# Patient Record
Sex: Male | Born: 1975 | Race: Black or African American | Hispanic: No | Marital: Single | Smoking: Light tobacco smoker
Health system: Southern US, Community
[De-identification: ages and names within clinical notes are randomized; demographics above are authoritative.]

## PROBLEM LIST (undated history)

## (undated) DIAGNOSIS — E7800 Pure hypercholesterolemia, unspecified: Secondary | ICD-10-CM

## (undated) DIAGNOSIS — E669 Obesity, unspecified: Secondary | ICD-10-CM

## (undated) DIAGNOSIS — W3400XA Accidental discharge from unspecified firearms or gun, initial encounter: Secondary | ICD-10-CM

## (undated) DIAGNOSIS — M6282 Rhabdomyolysis: Secondary | ICD-10-CM

## (undated) DIAGNOSIS — N401 Enlarged prostate with lower urinary tract symptoms: Secondary | ICD-10-CM

## (undated) DIAGNOSIS — I10 Essential (primary) hypertension: Secondary | ICD-10-CM

## (undated) DIAGNOSIS — N138 Other obstructive and reflux uropathy: Secondary | ICD-10-CM

## (undated) HISTORY — DX: Other obstructive and reflux uropathy: N13.8

## (undated) HISTORY — PX: OTHER SURGICAL HISTORY: SHX169

## (undated) HISTORY — DX: Rhabdomyolysis: M62.82

## (undated) HISTORY — DX: Benign prostatic hyperplasia with lower urinary tract symptoms: N40.1

## (undated) HISTORY — DX: Accidental discharge from unspecified firearms or gun, initial encounter: W34.00XA

## (undated) HISTORY — DX: Obesity, unspecified: E66.9

---

## 1998-06-18 ENCOUNTER — Encounter: Payer: Self-pay | Admitting: Emergency Medicine

## 1998-06-18 ENCOUNTER — Emergency Department (HOSPITAL_COMMUNITY): Admission: EM | Admit: 1998-06-18 | Discharge: 1998-06-18 | Payer: Self-pay | Admitting: Emergency Medicine

## 1999-02-06 DIAGNOSIS — W3400XA Accidental discharge from unspecified firearms or gun, initial encounter: Secondary | ICD-10-CM

## 1999-02-06 HISTORY — DX: Accidental discharge from unspecified firearms or gun, initial encounter: W34.00XA

## 1999-02-06 HISTORY — PX: FOREIGN BODY REMOVAL: SHX962

## 1999-02-13 ENCOUNTER — Encounter: Payer: Self-pay | Admitting: Emergency Medicine

## 1999-02-13 ENCOUNTER — Emergency Department (HOSPITAL_COMMUNITY): Admission: EM | Admit: 1999-02-13 | Discharge: 1999-02-13 | Payer: Self-pay | Admitting: Emergency Medicine

## 2011-04-23 ENCOUNTER — Emergency Department (HOSPITAL_COMMUNITY)
Admission: EM | Admit: 2011-04-23 | Discharge: 2011-04-23 | Disposition: A | Discharge status: Home / Self Care | Payer: Self-pay | Attending: Emergency Medicine | Admitting: Emergency Medicine

## 2011-04-23 DIAGNOSIS — Y9269 Other specified industrial and construction area as the place of occurrence of the external cause: Secondary | ICD-10-CM | POA: Insufficient documentation

## 2011-04-23 DIAGNOSIS — X58XXXA Exposure to other specified factors, initial encounter: Secondary | ICD-10-CM | POA: Insufficient documentation

## 2011-04-23 DIAGNOSIS — IMO0002 Reserved for concepts with insufficient information to code with codable children: Secondary | ICD-10-CM | POA: Insufficient documentation

## 2011-04-23 DIAGNOSIS — Y990 Civilian activity done for income or pay: Secondary | ICD-10-CM | POA: Insufficient documentation

## 2012-09-07 DIAGNOSIS — M6282 Rhabdomyolysis: Secondary | ICD-10-CM

## 2012-09-07 HISTORY — DX: Rhabdomyolysis: M62.82

## 2013-01-18 ENCOUNTER — Encounter (HOSPITAL_COMMUNITY): Payer: Self-pay | Admitting: *Deleted

## 2013-01-18 ENCOUNTER — Observation Stay (HOSPITAL_COMMUNITY)
Admission: EM | Admit: 2013-01-18 | Discharge: 2013-01-19 | Disposition: A | Discharge status: Home / Self Care | Payer: 59 | Attending: Internal Medicine | Admitting: Internal Medicine

## 2013-01-18 DIAGNOSIS — R197 Diarrhea, unspecified: Secondary | ICD-10-CM | POA: Insufficient documentation

## 2013-01-18 DIAGNOSIS — N179 Acute kidney failure, unspecified: Secondary | ICD-10-CM | POA: Insufficient documentation

## 2013-01-18 DIAGNOSIS — E785 Hyperlipidemia, unspecified: Secondary | ICD-10-CM | POA: Diagnosis present

## 2013-01-18 DIAGNOSIS — R109 Unspecified abdominal pain: Secondary | ICD-10-CM | POA: Diagnosis present

## 2013-01-18 DIAGNOSIS — R1084 Generalized abdominal pain: Principal | ICD-10-CM | POA: Insufficient documentation

## 2013-01-18 DIAGNOSIS — F17200 Nicotine dependence, unspecified, uncomplicated: Secondary | ICD-10-CM | POA: Insufficient documentation

## 2013-01-18 DIAGNOSIS — E860 Dehydration: Secondary | ICD-10-CM

## 2013-01-18 DIAGNOSIS — E7800 Pure hypercholesterolemia, unspecified: Secondary | ICD-10-CM | POA: Insufficient documentation

## 2013-01-18 DIAGNOSIS — M6282 Rhabdomyolysis: Secondary | ICD-10-CM | POA: Insufficient documentation

## 2013-01-18 DIAGNOSIS — R112 Nausea with vomiting, unspecified: Secondary | ICD-10-CM | POA: Insufficient documentation

## 2013-01-18 HISTORY — DX: Pure hypercholesterolemia, unspecified: E78.00

## 2013-01-18 LAB — CBC WITH DIFFERENTIAL/PLATELET
Basophils Absolute: 0 10*3/uL (ref 0.0–0.1)
Basophils Relative: 0 % (ref 0–1)
Eosinophils Absolute: 0.2 10*3/uL (ref 0.0–0.7)
Eosinophils Relative: 1 % (ref 0–5)
HCT: 42.1 % (ref 39.0–52.0)
Hemoglobin: 14.5 g/dL (ref 13.0–17.0)
Lymphocytes Relative: 18 % (ref 12–46)
Lymphs Abs: 2.8 10*3/uL (ref 0.7–4.0)
MCH: 29.8 pg (ref 26.0–34.0)
MCHC: 34.4 g/dL (ref 30.0–36.0)
MCV: 86.6 fL (ref 78.0–100.0)
Monocytes Absolute: 0.9 10*3/uL (ref 0.1–1.0)
Monocytes Relative: 6 % (ref 3–12)
Neutro Abs: 11.4 10*3/uL — ABNORMAL HIGH (ref 1.7–7.7)
Neutrophils Relative %: 75 % (ref 43–77)
Platelets: 199 10*3/uL (ref 150–400)
RBC: 4.86 MIL/uL (ref 4.22–5.81)
RDW: 14.2 % (ref 11.5–15.5)
WBC: 15.3 10*3/uL — ABNORMAL HIGH (ref 4.0–10.5)

## 2013-01-18 LAB — COMPREHENSIVE METABOLIC PANEL
ALT: 36 U/L (ref 0–53)
AST: 93 U/L — ABNORMAL HIGH (ref 0–37)
CO2: 28 mEq/L (ref 19–32)
Calcium: 9.5 mg/dL (ref 8.4–10.5)
Chloride: 104 mEq/L (ref 96–112)
GFR calc non Af Amer: 32 mL/min — ABNORMAL LOW (ref >90)
Sodium: 140 mEq/L (ref 135–145)
Total Bilirubin: 0.5 mg/dL (ref 0.3–1.2)

## 2013-01-18 LAB — URINE MICROSCOPIC-ADD ON

## 2013-01-18 LAB — URINALYSIS, ROUTINE W REFLEX MICROSCOPIC
Glucose, UA: NEGATIVE mg/dL
Protein, ur: NEGATIVE mg/dL
pH: 6 (ref 5.0–8.0)

## 2013-01-18 LAB — CK: Total CK: 9041 U/L — ABNORMAL HIGH (ref 7–232)

## 2013-01-18 MED ORDER — ACETAMINOPHEN 325 MG PO TABS
650.0000 mg | ORAL_TABLET | Freq: Four times a day (QID) | ORAL | Status: DC | PRN
  Administered 2013-01-18: 650 mg via ORAL
  Filled 2013-01-18: qty 2

## 2013-01-18 MED ORDER — ENOXAPARIN SODIUM 60 MG/0.6ML ~~LOC~~ SOLN
60.0000 mg | Freq: Every day | SUBCUTANEOUS | Status: DC
  Administered 2013-01-18: 60 mg via SUBCUTANEOUS
  Filled 2013-01-18 (×2): qty 0.6

## 2013-01-18 MED ORDER — ONDANSETRON HCL 4 MG PO TABS: 4.0000 mg | ORAL_TABLET | Freq: Four times a day (QID) | ORAL | Status: DC | PRN

## 2013-01-18 MED ORDER — ENOXAPARIN SODIUM 40 MG/0.4ML ~~LOC~~ SOLN: 40.0000 mg | SUBCUTANEOUS | Status: DC

## 2013-01-18 MED ORDER — ONDANSETRON 4 MG PO TBDP
8.0000 mg | ORAL_TABLET | Freq: Once | ORAL | Status: AC
  Administered 2013-01-18: 8 mg via ORAL
  Filled 2013-01-18: qty 2

## 2013-01-18 MED ORDER — SODIUM CHLORIDE 0.9 % IV BOLUS (SEPSIS)
1000.0000 mL | Freq: Once | INTRAVENOUS | Status: AC
  Administered 2013-01-18: 1000 mL via INTRAVENOUS

## 2013-01-18 MED ORDER — ONDANSETRON HCL 4 MG/2ML IJ SOLN: 4.0000 mg | Freq: Four times a day (QID) | INTRAMUSCULAR | Status: DC | PRN

## 2013-01-18 MED ORDER — ACETAMINOPHEN 650 MG RE SUPP: 650.0000 mg | Freq: Four times a day (QID) | RECTAL | Status: DC | PRN

## 2013-01-18 MED ORDER — SODIUM CHLORIDE 0.9 % IV SOLN
INTRAVENOUS | Status: DC
  Administered 2013-01-18 – 2013-01-19 (×2): via INTRAVENOUS

## 2013-01-18 NOTE — ED Provider Notes (Addendum)
History     CSN: [id number]  Arrival date & time 01/18/13  1906   First MD Initiated Contact with Patient 01/18/13 1934      Chief Complaint  Patient presents with  . Abdominal Pain    Patient is a 37 y.o. male presenting with abdominal pain. The history is provided by the patient.  Abdominal Pain Pain location:  Epigastric Pain quality: aching and cramping   Pain radiates to:  Does not radiate Onset quality:  Gradual Duration:  2 days Timing:  Intermittent Progression:  Unchanged Chronicity:  New Relieved by:  Nothing Worsened by:  Eating Associated symptoms: diarrhea, nausea and vomiting   Associated symptoms: no chest pain, no fever, no hematemesis and no hematochezia   pt presents for abdominal pain, nausea vomiting and diarrhea He reports he ate at restaurant two days ago, and soon after developed vomiting and diarrhea.  He reports now he has lack of appetite and only passing flatus.  He reports cramping abdominal pain.   He had otherwise been well prior He reports +urine output  Past Medical History  Diagnosis Date  . Hypercholesteremia     History reviewed. No pertinent past surgical history.  History reviewed. No pertinent family history.  History  Substance Use Topics  . Smoking status: Light Tobacco Smoker  . Smokeless tobacco: Never Used  . Alcohol Use: Yes      Review of Systems  Constitutional: Negative for fever.  Cardiovascular: Negative for chest pain.  Gastrointestinal: Positive for nausea, vomiting, abdominal pain and diarrhea. Negative for hematochezia and hematemesis.  All other systems reviewed and are negative.    Allergies  Amoxicillin; Other; and Penicillins  Home Medications   Current Outpatient Rx  Name  Route  Sig  Dispense  Refill  . niacin (NIASPAN) 500 MG CR tablet   Oral   Take 500 mg by mouth at bedtime.           BP 141/90  Pulse 81  Temp(Src) 98.2 F (36.8 C)  Resp 16  SpO2 94%  Physical  Exam CONSTITUTIONAL: Well developed/well nourished HEAD: Normocephalic/atraumatic EYES: EOMI/PERRL ENMT: Mucous membranes moist NECK: supple no meningeal signs SPINE:entire spine nontender CV: S1/S2 noted, no murmurs/rubs/gallops noted LUNGS: Lungs are clear to auscultation bilaterally, no apparent distress ABDOMEN: soft, mild epigastric tenderness, no rebound or guarding GU:no cva tenderness NEURO: Pt is awake/alert, moves all extremitiesx4 EXTREMITIES: pulses normal, full ROM SKIN: warm, color normal PSYCH: no abnormalities of mood noted  ED Course  Procedures  Labs Reviewed  CBC WITH DIFFERENTIAL - Abnormal; Notable for the following:    WBC 15.3 (*)    Neutro Abs 11.4 (*)    All other components within normal limits  COMPREHENSIVE METABOLIC PANEL - Abnormal; Notable for the following:    Creatinine, Ser 2.42 (*)    AST 93 (*)    GFR calc non Af Amer 32 (*)    GFR calc Af Amer 37 (*)    All other components within normal limits  URINALYSIS, ROUTINE W REFLEX MICROSCOPIC - Abnormal; Notable for the following:    Hgb urine dipstick SMALL (*)    All other components within normal limits  LIPASE, BLOOD  URINE MICROSCOPIC-ADD ON    9:04 PM Acute renal failure noted.  This is new onset.  Will admit Abdomen soft with mild epigastric tenderness.  Defer imaging for now   9:21 PM D/w [facility], to admit Pt told nurse he had recent increased working out/exercising, will check  CK while inpatient MDM  Nursing notes including past medical history and social history reviewed and considered in documentation Labs/vital reviewed and considered         Joya Gaskins, MD 01/18/13 2121  Joya Gaskins, MD 01/18/13 2123

## 2013-01-18 NOTE — ED Notes (Signed)
Pt states that after eating spinach on Monday from PF chang's that he began feeling nauseated, and vomited later the next day. His stomach has felt like a knot ever since and that his last BM was yesterday. He has been able to keep fluids down but has not had anything to eat since. Nausea comes and goes.
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
## 2013-01-18 NOTE — ED Notes (Signed)
Pt stated he just started working out again. Pt has not worked out in four months. Pt states he has been working out hard for the past week. Pt reports soreness. Dr. Bebe Shaggy notified of pt's exercise regiment

## 2013-01-19 DIAGNOSIS — E860 Dehydration: Secondary | ICD-10-CM

## 2013-01-19 LAB — CBC WITH DIFFERENTIAL/PLATELET
Basophils Relative: 0 % (ref 0–1)
Eosinophils Relative: 1 % (ref 0–5)
HCT: 41.5 % (ref 39.0–52.0)
Hemoglobin: 13.8 g/dL (ref 13.0–17.0)
Lymphs Abs: 3.4 10*3/uL (ref 0.7–4.0)
MCH: 29.1 pg (ref 26.0–34.0)
MCV: 87.6 fL (ref 78.0–100.0)
Monocytes Absolute: 1.1 10*3/uL — ABNORMAL HIGH (ref 0.1–1.0)
Monocytes Relative: 9 % (ref 3–12)
Neutro Abs: 7.9 10*3/uL — ABNORMAL HIGH (ref 1.7–7.7)
RBC: 4.74 MIL/uL (ref 4.22–5.81)
WBC: 12.5 10*3/uL — ABNORMAL HIGH (ref 4.0–10.5)

## 2013-01-19 LAB — COMPREHENSIVE METABOLIC PANEL
AST: 97 U/L — ABNORMAL HIGH (ref 0–37)
Albumin: 3.7 g/dL (ref 3.5–5.2)
Calcium: 9.1 mg/dL (ref 8.4–10.5)
Chloride: 108 mEq/L (ref 96–112)
Creatinine, Ser: 2.29 mg/dL — ABNORMAL HIGH (ref 0.50–1.35)
Total Bilirubin: 0.3 mg/dL (ref 0.3–1.2)
Total Protein: 6.8 g/dL (ref 6.0–8.3)

## 2013-01-19 LAB — CK: Total CK: 9247 U/L — ABNORMAL HIGH (ref 7–232)

## 2013-01-19 LAB — TSH: TSH: 1.553 u[IU]/mL (ref 0.350–4.500)

## 2013-01-19 MED ORDER — ACETAMINOPHEN 325 MG PO TABS: 650.0000 mg | ORAL_TABLET | Freq: Four times a day (QID) | ORAL | Status: DC | PRN

## 2013-01-19 NOTE — Progress Notes (Signed)
Pt discharged to home after visit summary reviewed and pt capable of re verbalizing medications and follow up appointments. Pt remains stable. No signs and symptoms of distress. Educated to return to ER in the event of SOB, dizziness, chest pain, or fainting. Kimanh Templeman, RN   

## 2013-01-19 NOTE — Discharge Summary (Signed)
Physician Discharge Summary  VUK SKILLERN VWU:[id number] DOB: [birth year] DOA: 01/18/2013  PCP: Evlyn Courier, MD  Admit date: 01/18/2013 Discharge date: 01/19/2013  Time spent: 25 minutes  Recommendations for Outpatient Follow-up:  1. Follow up with primary care provider in 2 weeks (include homehealth, outpatient follow-up instructions, specific recommendations for PCP to follow-up on, etc.)  Discharge Diagnoses:  Principal Problem:   Abdominal pain Active Problems:   ARF (acute renal failure)   Rhabdomyolysis   Hyperlipidemia   Discharge Condition: Improved  Diet recommendation: Regular diet  Filed Weights   01/18/13 2255  Weight: 121.972 kg (268 lb 14.4 oz)    History of present illness:  Andrew Escobar is a 37 y.o. male history of hyperlipidemia started experiencing abdominal pain since Monday night that is 2 nights ago. Patient had some fried rice which he had bought a day ago. He had it around 12 noon and started experiencing nausea vomiting and diarrhea with crampy abdominal pain around 8 PM same day. His nausea and vomiting and that he has stopped but patient started having persistent crampy abdominal pain. The abdominal pain is generalized. Since the pain is persistent he came to the ER today. His creatinine was found to be mildly elevated from normal as patient states his labs done last October was normal. In addition patient's CK levels are found to be high. Patient states that he has been exercising after gap of 2 months over the last one week. Patient otherwise denies any fever chills chest pain or shortness of breath dizziness or loss of consciousness. He does have some body aches.   Hospital Course:  The patient was noted to the inpatient service. The patient was started on aggressive fluid hydration overnight for presumed infectious gastroenteritis. The following day, patient was able to tolerate a regular diet. His creatinine also improved with fluid hydration.  Patient reports improved bowel function. Patient is otherwise appropriate for hospital discharge with close outpatient follow up. Patient is instructed to wash as vigorously. Patient has been instructed to continue with Tylenol as needed for pain and to avoid nonsteroidal anti-inflammatories given his presenting to renal failure  Procedures:  None (i.e. Studies not automatically included, echos, thoracentesis, etc; not x-rays)  Consultations:  None  Discharge Exam: Filed Vitals:   01/18/13 1916 01/18/13 2255 01/19/13 0500 01/19/13 0900  BP:  136/91 134/74 148/98  Pulse:  65 64 67  Temp: 98.2 F (36.8 C) 98 F (36.7 C) 97.6 F (36.4 C) 98.2 F (36.8 C)  TempSrc:  Oral Oral Oral  Resp:  18 19 20   Weight:  121.972 kg (268 lb 14.4 oz)    SpO2:  96% 96% 98%    General: Awake no apparent distress Cardiovascular: Regular S1-S2 no murmurs Respiratory: Normal respiratory effort no crackles no wheezing  Discharge Instructions     Medication List    TAKE these medications       acetaminophen 325 MG tablet  Commonly known as:  TYLENOL  Take 2 tablets (650 mg total) by mouth every 6 (six) hours as needed for pain.     niacin 500 MG CR tablet  Commonly known as:  NIASPAN  Take 500 mg by mouth at bedtime.       Allergies  Allergen Reactions  . Amoxicillin     Unknown  . Other     Mayonnaise: Unknown reaction  . Penicillins     Unknown       Follow-up Information   Follow up with  Evlyn Courier, MD In 2 weeks.   Contact information:   [address] [place] [address] [phone]        The results of significant diagnostics from this hospitalization (including imaging, microbiology, ancillary and laboratory) are listed below for reference.    Significant Diagnostic Studies: No results found.  Microbiology: No results found for this or any previous visit (from the past 240 hour(s)).   Labs: Basic Metabolic Panel:  Recent Labs Lab  01/18/13 1935 01/19/13 0640  NA 140 142  Escobar 4.3 4.5  CL 104 108  CO2 28 23  GLUCOSE 92 91  BUN 16 16  CREATININE 2.42* 2.29*  CALCIUM 9.5 9.1   Liver Function Tests:  Recent Labs Lab 01/18/13 1935 01/19/13 0640  AST 93* 97*  ALT 36 43  ALKPHOS 46 45  BILITOT 0.5 0.3  PROT 7.1 6.8  ALBUMIN 3.8 3.7    Recent Labs Lab 01/18/13 1935  LIPASE 26   No results found for this basename: AMMONIA,  in the last 168 hours CBC:  Recent Labs Lab 01/18/13 1935 01/19/13 0640  WBC 15.3* 12.5*  NEUTROABS 11.4* 7.9*  HGB 14.5 13.8  HCT 42.1 41.5  MCV 86.6 87.6  PLT 199 200   Cardiac Enzymes:  Recent Labs Lab 01/18/13 1935 01/19/13 0640  CKTOTAL 9041* 9247*   BNP: BNP (last 3 results) No results found for this basename: PROBNP,  in the last 8760 hours CBG: No results found for this basename: GLUCAP,  in the last 168 hours     Signed:  CHIU, Andrew Escobar  [facility] 01/19/2013, 1:28 PM

## 2013-01-19 NOTE — Progress Notes (Addendum)
Pt admitted to the unit. Pt is alert and oriented. Pt oriented to room, staff, and call bell. Bed in lowest position. Full assessment to Epic. Call bell with in reach. Told to call for assists. Will continue to monitor. Burley Saver, RN

## 2015-06-07 ENCOUNTER — Emergency Department (HOSPITAL_COMMUNITY): Payer: Self-pay

## 2015-06-07 ENCOUNTER — Emergency Department (HOSPITAL_COMMUNITY)
Admission: EM | Admit: 2015-06-07 | Discharge: 2015-06-08 | Disposition: A | Discharge status: Home / Self Care | Payer: Self-pay | Attending: Emergency Medicine | Admitting: Emergency Medicine

## 2015-06-07 ENCOUNTER — Encounter (HOSPITAL_COMMUNITY): Payer: Self-pay | Admitting: Emergency Medicine

## 2015-06-07 DIAGNOSIS — Z720 Tobacco use: Secondary | ICD-10-CM | POA: Insufficient documentation

## 2015-06-07 DIAGNOSIS — Z880 Allergy status to penicillin: Secondary | ICD-10-CM | POA: Insufficient documentation

## 2015-06-07 DIAGNOSIS — R531 Weakness: Secondary | ICD-10-CM | POA: Insufficient documentation

## 2015-06-07 DIAGNOSIS — R030 Elevated blood-pressure reading, without diagnosis of hypertension: Secondary | ICD-10-CM | POA: Insufficient documentation

## 2015-06-07 DIAGNOSIS — IMO0001 Reserved for inherently not codable concepts without codable children: Secondary | ICD-10-CM

## 2015-06-07 DIAGNOSIS — R51 Headache: Secondary | ICD-10-CM | POA: Insufficient documentation

## 2015-06-07 DIAGNOSIS — Z8639 Personal history of other endocrine, nutritional and metabolic disease: Secondary | ICD-10-CM | POA: Insufficient documentation

## 2015-06-07 DIAGNOSIS — R079 Chest pain, unspecified: Secondary | ICD-10-CM | POA: Insufficient documentation

## 2015-06-07 DIAGNOSIS — R42 Dizziness and giddiness: Secondary | ICD-10-CM | POA: Insufficient documentation

## 2015-06-07 DIAGNOSIS — M791 Myalgia: Secondary | ICD-10-CM | POA: Insufficient documentation

## 2015-06-07 NOTE — ED Notes (Signed)
Pt. reports elevated blood pressure yesterday 160/100 with nausea , generalized weakness and mild frontal headache " pressure" .
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
## 2015-06-08 LAB — CBC WITH DIFFERENTIAL/PLATELET
BASOS ABS: 0.1 10*3/uL (ref 0.0–0.1)
BASOS PCT: 0 %
Eosinophils Absolute: 0.2 10*3/uL (ref 0.0–0.7)
Eosinophils Relative: 2 %
HEMATOCRIT: 42.7 % (ref 39.0–52.0)
HEMOGLOBIN: 14.4 g/dL (ref 13.0–17.0)
LYMPHS PCT: 33 %
Lymphs Abs: 3.9 10*3/uL (ref 0.7–4.0)
MCH: 29.6 pg (ref 26.0–34.0)
MCHC: 33.7 g/dL (ref 30.0–36.0)
MCV: 87.9 fL (ref 78.0–100.0)
MONO ABS: 0.6 10*3/uL (ref 0.1–1.0)
MONOS PCT: 5 %
NEUTROS ABS: 7.2 10*3/uL (ref 1.7–7.7)
NEUTROS PCT: 60 %
Platelets: 210 10*3/uL (ref 150–400)
RBC: 4.86 MIL/uL (ref 4.22–5.81)
RDW: 14.3 % (ref 11.5–15.5)
WBC: 12 10*3/uL — ABNORMAL HIGH (ref 4.0–10.5)

## 2015-06-08 LAB — COMPREHENSIVE METABOLIC PANEL
ALBUMIN: 4.3 g/dL (ref 3.5–5.0)
ALT: 19 U/L (ref 17–63)
ANION GAP: 9 (ref 5–15)
AST: 21 U/L (ref 15–41)
Alkaline Phosphatase: 50 U/L (ref 38–126)
BUN: 12 mg/dL (ref 6–20)
CALCIUM: 9.3 mg/dL (ref 8.9–10.3)
CO2: 25 mmol/L (ref 22–32)
Chloride: 105 mmol/L (ref 101–111)
Creatinine, Ser: 1.01 mg/dL (ref 0.61–1.24)
GFR calc non Af Amer: >60 mL/min (ref >60)
GLUCOSE: 115 mg/dL — AB (ref 65–99)
POTASSIUM: 3.7 mmol/L (ref 3.5–5.1)
SODIUM: 139 mmol/L (ref 135–145)
TOTAL PROTEIN: 6.7 g/dL (ref 6.5–8.1)
Total Bilirubin: 0.7 mg/dL (ref 0.3–1.2)

## 2015-06-08 LAB — TROPONIN I: Troponin I: <0.03 ng/mL (ref <0.031)

## 2015-06-08 NOTE — ED Notes (Signed)
Pt stable, ambulatory, states understanding of discharge instructions 

## 2015-06-08 NOTE — Discharge Instructions (Signed)
DASH Eating Plan °DASH stands for "Dietary Approaches to Stop Hypertension." The DASH eating plan is a healthy eating plan that has been shown to reduce high blood pressure (hypertension). Additional health benefits may include reducing the risk of type 2 diabetes mellitus, heart disease, and stroke. The DASH eating plan may also help with weight loss. °WHAT DO I NEED TO KNOW ABOUT THE DASH EATING PLAN? °For the DASH eating plan, you will follow these general guidelines: °· Choose foods with a percent daily value for sodium of less than 5% (as listed on the food label). °· Use salt-free seasonings or herbs instead of table salt or sea salt. °· Check with your health care provider or pharmacist before using salt substitutes. °· Eat lower-sodium products, often labeled as "lower sodium" or "no salt added." °· Eat fresh foods. °· Eat more vegetables, fruits, and low-fat dairy products. °· Choose whole grains. Look for the word "whole" as the first word in the ingredient list. °· Choose fish and skinless chicken or turkey more often than red meat. Limit fish, poultry, and meat to 6 oz (170 g) each day. °· Limit sweets, desserts, sugars, and sugary drinks. °· Choose heart-healthy fats. °· Limit cheese to 1 oz (28 g) per day. °· Eat more home-cooked food and less restaurant, buffet, and fast food. °· Limit fried foods. °· Cook foods using methods other than frying. °· Limit canned vegetables. If you do use them, rinse them well to decrease the sodium. °· When eating at a restaurant, ask that your food be prepared with less salt, or no salt if possible. °WHAT FOODS CAN I EAT? °Seek help from a dietitian for individual calorie needs. °Grains °Whole grain or whole wheat bread. Brown rice. Whole grain or whole wheat pasta. Quinoa, bulgur, and whole grain cereals. Low-sodium cereals. Corn or whole wheat flour tortillas. Whole grain cornbread. Whole grain crackers. Low-sodium crackers. °Vegetables °Fresh or frozen vegetables  (raw, steamed, roasted, or grilled). Low-sodium or reduced-sodium tomato and vegetable juices. Low-sodium or reduced-sodium tomato sauce and paste. Low-sodium or reduced-sodium canned vegetables.  °Fruits °All fresh, canned (in natural juice), or frozen fruits. °Meat and Other Protein Products °Ground beef (85% or leaner), grass-fed beef, or beef trimmed of fat. Skinless chicken or turkey. Ground chicken or turkey. Pork trimmed of fat. All fish and seafood. Eggs. Dried beans, peas, or lentils. Unsalted nuts and seeds. Unsalted canned beans. °Dairy °Low-fat dairy products, such as skim or 1% milk, 2% or reduced-fat cheeses, low-fat ricotta or cottage cheese, or plain low-fat yogurt. Low-sodium or reduced-sodium cheeses. °Fats and Oils °Tub margarines without trans fats. Light or reduced-fat mayonnaise and salad dressings (reduced sodium). Avocado. Safflower, olive, or canola oils. Natural peanut or almond butter. °Other °Unsalted popcorn and pretzels. °The items listed above may not be a complete list of recommended foods or beverages. Contact your dietitian for more options. °WHAT FOODS ARE NOT RECOMMENDED? °Grains °White bread. White pasta. White rice. Refined cornbread. Bagels and croissants. Crackers that contain trans fat. °Vegetables °Creamed or fried vegetables. Vegetables in a cheese sauce. Regular canned vegetables. Regular canned tomato sauce and paste. Regular tomato and vegetable juices. °Fruits °Dried fruits. Canned fruit in light or heavy syrup. Fruit juice. °Meat and Other Protein Products °Fatty cuts of meat. Ribs, chicken wings, bacon, sausage, bologna, salami, chitterlings, fatback, hot dogs, bratwurst, and packaged luncheon meats. Salted nuts and seeds. Canned beans with salt. °Dairy °Whole or 2% milk, cream, half-and-half, and cream cheese. Whole-fat or sweetened yogurt. Full-fat   cheeses or blue cheese. Nondairy creamers and whipped toppings. Processed cheese, cheese spreads, or cheese  curds. Condiments Onion and garlic salt, seasoned salt, table salt, and sea salt. Canned and packaged gravies. Worcestershire sauce. Tartar sauce. Barbecue sauce. Teriyaki sauce. Soy sauce, including reduced sodium. Steak sauce. Fish sauce. Oyster sauce. Cocktail sauce. Horseradish. Ketchup and mustard. Meat flavorings and tenderizers. Bouillon cubes. Hot sauce. Tabasco sauce. Marinades. Taco seasonings. Relishes. Fats and Oils Butter, stick margarine, lard, shortening, ghee, and bacon fat. Coconut, palm kernel, or palm oils. Regular salad dressings. Other Pickles and olives. Salted popcorn and pretzels. The items listed above may not be a complete list of foods and beverages to avoid. Contact your dietitian for more information. WHERE CAN I FIND MORE INFORMATION? National Heart, Lung, and [employer] Institute: travelstabloid.com Document Released: 08/13/2011 Document Revised: 01/08/2014 Document Reviewed: 06/28/2013 [facility] Patient Information 2015 [place], [place]. This information is not intended to replace advice given to you by your health care provider. Make sure you discuss any questions you have with your health care provider.  Hypertension Hypertension, commonly called high blood pressure, is when the force of blood pumping through your arteries is too strong. Your arteries are the blood vessels that carry blood from your heart throughout your body. A blood pressure reading consists of a higher number over a lower number, such as 110/72. The higher number (systolic) is the pressure inside your arteries when your heart pumps. The lower number (diastolic) is the pressure inside your arteries when your heart relaxes. Ideally you want your blood pressure below 120/80. Hypertension forces your heart to work harder to pump blood. Your arteries may become narrow or stiff. Having hypertension puts you at risk for heart disease, stroke, and other problems.  RISK  FACTORS Some risk factors for high blood pressure are controllable. Others are not.  Risk factors you cannot control include:   Race. You may be at higher risk if you are African American.  Age. Risk increases with age.  Gender. Men are at higher risk than women before age 67 years. After age 26, women are at higher risk than men. Risk factors you can control include:  Not getting enough exercise or physical activity.  Being overweight.  Getting too much fat, sugar, calories, or salt in your diet.  Drinking too much alcohol. SIGNS AND SYMPTOMS Hypertension does not usually cause signs or symptoms. Extremely high blood pressure (hypertensive crisis) may cause headache, anxiety, shortness of breath, and nosebleed. DIAGNOSIS  To check if you have hypertension, your health care provider will measure your blood pressure while you are seated, with your arm held at the level of your heart. It should be measured at least twice using the same arm. Certain conditions can cause a difference in blood pressure between your right and left arms. A blood pressure reading that is higher than normal on one occasion does not mean that you need treatment. If one blood pressure reading is high, ask your health care provider about having it checked again. TREATMENT  Treating high blood pressure includes making lifestyle changes and possibly taking medicine. Living a healthy lifestyle can help lower high blood pressure. You may need to change some of your habits. Lifestyle changes may include:  Following the DASH diet. This diet is high in fruits, vegetables, and whole grains. It is low in salt, red meat, and added sugars.  Getting at least 2 hours of brisk physical activity every week.  Losing weight if necessary.  Not smoking.  Limiting alcoholic beverages.  Learning ways to reduce stress. If lifestyle changes are not enough to get your blood pressure under control, your health care provider may  prescribe medicine. You may need to take more than one. Work closely with your health care provider to understand the risks and benefits. HOME CARE INSTRUCTIONS  Have your blood pressure rechecked as directed by your health care provider.   Take medicines only as directed by your health care provider. Follow the directions carefully. Blood pressure medicines must be taken as prescribed. The medicine does not work as well when you skip doses. Skipping doses also puts you at risk for problems.   Do not smoke.   Monitor your blood pressure at home as directed by your health care provider. SEEK MEDICAL CARE IF:   You think you are having a reaction to medicines taken.  You have recurrent headaches or feel dizzy.  You have swelling in your ankles.  You have trouble with your vision. SEEK IMMEDIATE MEDICAL CARE IF:  You develop a severe headache or confusion.  You have unusual weakness, numbness, or feel faint.  You have severe chest or abdominal pain.  You vomit repeatedly.  You have trouble breathing. MAKE SURE YOU:   Understand these instructions.  Will watch your condition.  Will get help right away if you are not doing well or get worse. Document Released: 08/24/2005 Document Revised: 01/08/2014 Document Reviewed: 06/16/2013 [facility] Patient Information 2015 [place], [place]. This information is not intended to replace advice given to you by your health care provider. Make sure you discuss any questions you have with your health care provider.  How to Take Your Blood Pressure HOW DO I GET A BLOOD PRESSURE MACHINE?  You can buy an electronic home blood pressure machine at your local pharmacy. Insurance will sometimes cover the cost if you have a prescription.  Ask your doctor what type of machine is best for you. There are different machines for your arm and your wrist.  If you decide to buy a machine to check your blood pressure on your arm, first check the size of  your arm so you can buy the right size cuff. To check the size of your arm:   Use a measuring tape that shows both inches and centimeters.   Wrap the measuring tape around the upper-middle part of your arm. You may need someone to help you measure.   Write down your arm measurement in both inches and centimeters.   To measure your blood pressure correctly, it is important to have the right size cuff.   If your arm is up to 13 inches (up to 34 centimeters), get an adult cuff size.  If your arm is 13 to 17 inches (35 to 44 centimeters), get a large adult cuff size.    If your arm is 17 to 20 inches (45 to 52 centimeters), get an adult thigh cuff.  WHAT DO THE NUMBERS MEAN?   There are two numbers that make up your blood pressure. For example: 120/80.  The first number (120 in our example) is called the "systolic pressure." It is a measure of the pressure in your blood vessels when your heart is pumping blood.  The second number (80 in our example) is called the "diastolic pressure." It is a measure of the pressure in your blood vessels when your heart is resting between beats.  Your doctor will tell you what your blood pressure should be. WHAT SHOULD I DO BEFORE  I CHECK MY BLOOD PRESSURE?   Try to rest or relax for at least 30 minutes before you check your blood pressure.  Do not smoke.  Do not have any drinks with caffeine, such as:  Soda.  Coffee.  Tea.  Check your blood pressure in a quiet room.  Sit down and stretch out your arm on a table. Keep your arm at about the level of your heart. Let your arm relax.  Make sure that your legs are not crossed. HOW DO I CHECK MY BLOOD PRESSURE?  Follow the directions that came with your machine.  Make sure you remove any tight-fighting clothing from your arm or wrist. Wrap the cuff around your upper arm or wrist. You should be able to fit a finger between the cuff and your arm. If you cannot fit a finger between the cuff  and your arm, it is too tight and should be removed and rewrapped.  Some units require you to manually pump up the arm cuff.  Automatic units inflate the cuff when you press a button.  Cuff deflation is automatic in both models.  After the cuff is inflated, the unit measures your blood pressure and pulse. The readings are shown on a monitor. Hold still and breathe normally while the cuff is inflated.  Getting a reading takes less than a minute.  Some models store readings in a memory. Some provide a printout of readings. If your machine does not store your readings, keep a written record.  Take readings with you to your next visit with your doctor. Document Released: 08/06/2008 Document Revised: 01/08/2014 Document Reviewed: 10/19/2013 [facility] Patient Information 2015 [place], [place]. This information is not intended to replace advice given to you by your health care provider. Make sure you discuss any questions you have with your health care provider.

## 2015-07-05 ENCOUNTER — Encounter (HOSPITAL_COMMUNITY): Payer: Self-pay | Admitting: *Deleted

## 2015-07-05 ENCOUNTER — Emergency Department (HOSPITAL_COMMUNITY)
Admission: EM | Admit: 2015-07-05 | Discharge: 2015-07-05 | Disposition: A | Discharge status: Home / Self Care | Payer: Self-pay | Attending: Emergency Medicine | Admitting: Emergency Medicine

## 2015-07-05 DIAGNOSIS — Z880 Allergy status to penicillin: Secondary | ICD-10-CM | POA: Insufficient documentation

## 2015-07-05 DIAGNOSIS — Z8639 Personal history of other endocrine, nutritional and metabolic disease: Secondary | ICD-10-CM | POA: Insufficient documentation

## 2015-07-05 DIAGNOSIS — K029 Dental caries, unspecified: Secondary | ICD-10-CM | POA: Insufficient documentation

## 2015-07-05 DIAGNOSIS — Z720 Tobacco use: Secondary | ICD-10-CM | POA: Insufficient documentation

## 2015-07-05 MED ORDER — OXYCODONE-ACETAMINOPHEN 5-325 MG PO TABS
1.0000 | ORAL_TABLET | Freq: Once | ORAL | Status: AC
  Administered 2015-07-05: 1 via ORAL
  Filled 2015-07-05: qty 1

## 2015-07-05 MED ORDER — CLINDAMYCIN HCL 150 MG PO CAPS: 300.0000 mg | ORAL_CAPSULE | Freq: Three times a day (TID) | ORAL | Status: DC

## 2015-07-05 MED ORDER — HYDROCODONE-ACETAMINOPHEN 5-325 MG PO TABS: 1.0000 | ORAL_TABLET | Freq: Four times a day (QID) | ORAL | Status: DC | PRN

## 2015-07-05 MED ORDER — NAPROXEN 500 MG PO TABS: 500.0000 mg | ORAL_TABLET | Freq: Two times a day (BID) | ORAL | Status: DC

## 2015-07-05 MED ORDER — CLINDAMYCIN HCL 150 MG PO CAPS
300.0000 mg | ORAL_CAPSULE | Freq: Once | ORAL | Status: AC
  Administered 2015-07-05: 300 mg via ORAL
  Filled 2015-07-05: qty 2
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
## 2015-07-05 NOTE — ED Notes (Signed)
Pt reports severe right side dental pain that is also causing headache.

## 2015-07-05 NOTE — Discharge Instructions (Signed)
Call Dr. Mariel Sleet on Monday and tell them you were seen in the ED and need follow up  Dental Caries Dental caries is tooth decay. This decay can cause a hole in teeth (cavity) that can get bigger and deeper over time. HOME CARE  Brush and floss your teeth. Do this at least two times a day.  Use a fluoride toothpaste.  Use a mouth rinse if told by your dentist or doctor.  Eat less sugary and starchy foods. Drink less sugary drinks.  Avoid snacking often on sugary and starchy foods. Avoid sipping often on sugary drinks.  Keep regular checkups and cleanings with your dentist.  Use fluoride supplements if told by your dentist or doctor.  Allow fluoride to be applied to teeth if told by your dentist or doctor.   This information is not intended to replace advice given to you by your health care provider. Make sure you discuss any questions you have with your health care provider.   Document Released: 06/02/2008 Document Revised: 09/14/2014 Document Reviewed: 08/26/2012 Elsevier Interactive Patient Education [employer].

## 2015-07-06 DIAGNOSIS — H5710 Ocular pain, unspecified eye: Secondary | ICD-10-CM | POA: Insufficient documentation

## 2015-07-06 DIAGNOSIS — Z720 Tobacco use: Secondary | ICD-10-CM | POA: Insufficient documentation

## 2015-07-06 DIAGNOSIS — K029 Dental caries, unspecified: Secondary | ICD-10-CM | POA: Insufficient documentation

## 2015-07-06 DIAGNOSIS — Z880 Allergy status to penicillin: Secondary | ICD-10-CM | POA: Insufficient documentation

## 2015-07-06 DIAGNOSIS — Z791 Long term (current) use of non-steroidal anti-inflammatories (NSAID): Secondary | ICD-10-CM | POA: Insufficient documentation

## 2015-07-06 DIAGNOSIS — Z792 Long term (current) use of antibiotics: Secondary | ICD-10-CM | POA: Insufficient documentation

## 2015-07-06 DIAGNOSIS — Z8639 Personal history of other endocrine, nutritional and metabolic disease: Secondary | ICD-10-CM | POA: Insufficient documentation

## 2015-07-07 ENCOUNTER — Emergency Department (HOSPITAL_COMMUNITY)
Admission: EM | Admit: 2015-07-07 | Discharge: 2015-07-07 | Disposition: A | Discharge status: Home / Self Care | Payer: Self-pay | Attending: Emergency Medicine | Admitting: Emergency Medicine

## 2015-07-07 ENCOUNTER — Encounter (HOSPITAL_COMMUNITY): Payer: Self-pay | Admitting: *Deleted

## 2015-07-07 ENCOUNTER — Encounter (HOSPITAL_COMMUNITY): Payer: Self-pay | Admitting: Emergency Medicine

## 2015-07-07 DIAGNOSIS — R202 Paresthesia of skin: Secondary | ICD-10-CM | POA: Insufficient documentation

## 2015-07-07 DIAGNOSIS — Z880 Allergy status to penicillin: Secondary | ICD-10-CM | POA: Insufficient documentation

## 2015-07-07 DIAGNOSIS — R063 Periodic breathing: Secondary | ICD-10-CM | POA: Insufficient documentation

## 2015-07-07 DIAGNOSIS — R0682 Tachypnea, not elsewhere classified: Secondary | ICD-10-CM

## 2015-07-07 DIAGNOSIS — Z8639 Personal history of other endocrine, nutritional and metabolic disease: Secondary | ICD-10-CM | POA: Insufficient documentation

## 2015-07-07 DIAGNOSIS — Z791 Long term (current) use of non-steroidal anti-inflammatories (NSAID): Secondary | ICD-10-CM | POA: Insufficient documentation

## 2015-07-07 DIAGNOSIS — K0889 Other specified disorders of teeth and supporting structures: Secondary | ICD-10-CM

## 2015-07-07 MED ORDER — LORAZEPAM 1 MG PO TABS
1.0000 mg | ORAL_TABLET | Freq: Once | ORAL | Status: AC
  Administered 2015-07-07: 1 mg via ORAL
  Filled 2015-07-07: qty 1

## 2015-07-07 MED ORDER — FLUORESCEIN SODIUM 1 MG OP STRP: 1.0000 | ORAL_STRIP | Freq: Once | OPHTHALMIC | Status: DC

## 2015-07-07 MED ORDER — TETRACAINE HCL 0.5 % OP SOLN: 1.0000 [drp] | Freq: Once | OPHTHALMIC | Status: DC

## 2015-07-07 MED ORDER — OXYCODONE-ACETAMINOPHEN 5-325 MG PO TABS
1.0000 | ORAL_TABLET | Freq: Once | ORAL | Status: AC
  Administered 2015-07-07: 1 via ORAL
  Filled 2015-07-07: qty 1

## 2015-07-07 MED ORDER — OXYCODONE-ACETAMINOPHEN 5-325 MG PO TABS
1.0000 | ORAL_TABLET | Freq: Four times a day (QID) | ORAL | Status: DC | PRN
Start: 2015-07-07 — End: 2016-12-23

## 2015-07-07 MED ORDER — ONDANSETRON 4 MG PO TBDP
4.0000 mg | ORAL_TABLET | Freq: Once | ORAL | Status: AC
  Administered 2015-07-07: 4 mg via ORAL
  Filled 2015-07-07: qty 1

## 2015-07-07 MED ORDER — LORAZEPAM 1 MG PO TABS: 1.0000 mg | ORAL_TABLET | Freq: Once | ORAL | Status: DC

## 2015-07-07 MED ORDER — ACETAMINOPHEN 500 MG PO TABS: 500.0000 mg | ORAL_TABLET | Freq: Four times a day (QID) | ORAL | Status: DC | PRN
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
## 2015-07-07 NOTE — ED Notes (Signed)
Pt. reports right eye pain onset this evening , denies injury , no blurred vision , pt. seen here yesterday for for dental pain prescribed with pain medications and antibiotic . Pt. Has taken the pain medication with no relief.

## 2015-07-07 NOTE — ED Notes (Signed)
MD at bedside. 

## 2015-07-07 NOTE — ED Notes (Signed)
Pt reports about 20-30 minutes after taking clindamycin and percocet for a toothache he began to feel very anxious and nervous. Pt reports only allergy is to penicillin, advised he does not feel short of breath or like his tongue or throat is swelling. Patient appears very anxious.
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
## 2015-07-07 NOTE — ED Notes (Signed)
PA at the bedside.

## 2015-07-07 NOTE — Discharge Instructions (Signed)
Dental Pain Dental pain may be caused by many things, including:  Tooth decay (cavities or caries). Cavities expose the nerve of your tooth to air and hot or cold temperatures. This can cause pain or discomfort.  Abscess or infection. A dental abscess is a collection of infected pus from a bacterial infection in the inner part of the tooth (pulp). It usually occurs at the end of the tooth's root.  Injury.  An unknown reason (idiopathic). Your pain may be mild or severe. It may only occur when:  You are chewing.  You are exposed to hot or cold temperature.  You are eating or drinking sugary foods or beverages, such as soda or candy. Your pain may also be constant. HOME CARE INSTRUCTIONS Watch your dental pain for any changes. The following actions may help to lessen any discomfort that you are feeling:  Take medicines only as directed by your dentist.  If you were prescribed an antibiotic medicine, finish all of it even if you start to feel better.  Keep all follow-up visits as directed by your dentist. This is important.  Do not apply heat to the outside of your face.  Rinse your mouth or gargle with salt water if directed by your dentist. This helps with pain and swelling.  You can make salt water by adding  tsp of salt to 1 cup of warm water.  Apply ice to the painful area of your face:  Put ice in a plastic bag.  Place a towel between your skin and the bag.  Leave the ice on for 20 minutes, 2-3 times per day.  Avoid foods or drinks that cause you pain, such as:  Very hot or very cold foods or drinks.  Sweet or sugary foods or drinks. SEEK MEDICAL CARE IF:  Your pain is not controlled with medicines.  Your symptoms are worse.  You have new symptoms. SEEK IMMEDIATE MEDICAL CARE IF:  You are unable to open your mouth.  You are having trouble breathing or swallowing.  You have a fever.  Your face, neck, or jaw is swollen.   This information is not  intended to replace advice given to you by your health care provider. Make sure you discuss any questions you have with your health care provider.   Document Released: 08/24/2005 Document Revised: 01/08/2015 Document Reviewed: 08/20/2014 Elsevier Interactive Patient Education 2016 [place] GUIDE  Chronic Pain Problems: Contact [facility]  [phone] Patients need to be referred by their primary care doctor.  Insufficient Money for Medicine: Contact [employer]:  call "211" or [place] [phone].  No Primary Care Doctor: Call Health Connect  [phone] - can help you locate a primary care doctor that  accepts your insurance, provides certain services, etc. Physician Referral [phone]  Agencies that provide inexpensive medical care: [name]  [facility]  [phone] [facility]  [phone] [facility]  [phone] [facility]  [phone] [facility]  [phone]  [place] Providers: [facility]- [address]  [phone], Mon-Fri 9am-7pm, Sat 9am-1pm [place], [address] [place]  [place], [address] [place]  [place]- [address]  [place], [address], [phone]  Only accepts [place] patients after they have their name  applied to their card  Self Pay (no insurance) in [facility]: Sickle Cell Patients: Dr Kevan Ny, [facility]  [place], [facility]- [place]  [facility]- [address], [facility]- see information above (Speak to [employer] if you do not have insurance)       -  [facility]- [place], [place],  [place], [place]  Dr Vista Lawman-  [address], [place], [place] [facility]- [address] [phone]       -  [facility]- [address], [place], also [address], [phone]       -    [facility]- [address], [place], 1st & 3rd Saturday   every month, 10am-1pm  1) Find a Doctor and Pay Out of Pocket Although you won't have to find out who is covered by your insurance plan, it is a good idea to ask around and get recommendations. You will then need to call the office and see if the doctor you have chosen will accept you as a new patient and what types of options they offer for patients who are self-pay. Some doctors offer discounts or will set up payment plans for their patients who do not have insurance, but you will need to ask so you aren't surprised when you get to your appointment.  2) Contact Your Local Health Department Not all health departments have doctors that can see patients for sick visits, but many do, so it is worth a call to see if yours does. If you don't know where your local health department is, you can check in your phone book. The CDC also has a tool to help you locate your state's health department, and many state websites also have listings of all of their local health departments.  3) Find a [place] [facility] If your illness is not likely to be very severe or complicated, you may want to try a walk in clinic. These are popping up all over the country in pharmacies, drugstores, and shopping centers. They're usually staffed by nurse practitioners or physician assistants that have been trained to treat common illnesses and complaints. They're usually fairly quick and inexpensive. However, if you have serious medical issues or chronic  medical problems, these are probably not your best option  [facility] [place], [facility], [address] [place], phone [phone] or [phone].  Monday - Friday, call for an appointment. [place], [facility], [address], [place], phone [phone] or [phone].  Monday - Friday, call for an appointment.  Abuse/Neglect: [place] [phone] [place] [phone] (After Hours)  Emergency Shelter:  [facility] [phone]  Maternity Homes: Room at the [place] [phone] [place] [phone]  MRSA Hotline #:   334-175-6173  [facility]  [facility] Dept. [address]         [place] Phone:  [phone]                                  Phone:  [phone]                   Phone:  [phone]  [facility], [place]- [phone]       -     [facility] in [place], [address],                                  [place] [phone] or [phone] (After Hours)   [place]  Substance Abuse Resources: Alcohol and Drug Services  [phone] [place] [phone] [facility] Chinita Pester [phone] Residential & Outpatient Substance Abuse Program  [phone]  Psychological Services: [place]  [phone] [place]  [place], [place]. [address] [place], [place]:  [phone] or [phone], [email or web address]  Dental Assistance  If unable to pay or uninsured, contact:  [facility] or [facility]. to become qualified for the adult dental clinic.  Patients with Medicaid: [facility] [phone] [address] [place], [place] [address] [phone]  If unable to pay, or uninsured, contact [facility] ([phone]) or [place] [phone] in [place], [place] in [facility]) to become qualified for the adult dental clinic  Other [place]- [place], [place], [place], [address], [place], [place], 2nd and 4th Thursday of the month at 6:30am.  10 clients each day by appointment, can sometimes see walk-in patients if someone does not show for an appointment. [facility]- [address] Hillard Danker [place], [place], [address], [place], [place], [place], [address], [place] [facility] [phone]  Please make every effort to establish with a primary care physician for routine medical care  [place]  The [place] provides a wide range of adult health services. Some of these services are designed to address the healthcare needs of all [facility] residents and all services are designed to meet the needs of uninsured/underinsured low income residents. Some services are available to any resident of [place], call [phone] for details. ] The [facility], a new medical clinic for adults, is now open. For more information about the Center and its services please call  [phone]. For information on our [place] services, click here.  For more information on any of the following  Department of Public Health programs, including hours of service, click on the highlighted link.  SERVICES FOR WOMEN (Adults and Teens) [employer] provide a full range of birth control options plus education and counseling. New patient visit and annual return visits include a complete examination, pap test as indicated, and other laboratory as indicated. Included is our [employer] for men.  Maternity Care is provided through pregnancy, including a six week post partum exam. Women who meet eligibility criteria for the Medicaid for Pregnant Women program, receive care free. Other women are charged on a sliding scale according to income. Note: [facility] provides services to pregnant women who have a Medicaid card. Call [phone] for an appointment in [place] or [phone] for an appointment in [facility].  Primary Care for Medicaid [place] Access Women is available through the [place]. As primary care provider for the [place] program, women may designate the [facility] as their primary care provider.  PLEASE CALL R5958090 FOR AN APPOINTMENT FOR THE ABOVE SERVICES IN EITHER [place] OR [place]. Information available in [place] and [place].   Childbirth Education Classes are open to the public and offered to help families prepare for the best possible childbirth experience as well as to promote lifelong health and wellness. Classes are offered throughout the year and meet on the same night once a week for five weeks. Medicaid covers the cost of the classes for the mother-to-be and her partner. For participants without Medicaid, the cost of the class series is $45.00 for the mother-to-be and her partner. Class size is limited and registration is required. For more information or to register call [phone]. Baby items donated by [employer] and the [employer] of  [place] are given away during each class series.  SERVICES FOR WOMEN AND MEN Sexually Transmitted Infection appointments, including HIV testing, are available daily (weekdays, except holidays). Call early as same-day appointments are limited. For an appointment in either [facility] or [place], call [phone]. Services are confidential and free of charge.  Skin Testing for Tuberculosis Please call [phone]. Adult Immunizations are available, usually for a fee. Please call [phone] for details.  PLEASE CALL [phone] FOR AN APPOINTMENT FOR THE ABOVE SERVICES IN EITHER [place] OR [place].   [facility] provides up to the minute recommended vaccines for your travel destination. We also provide essential health and political information to help insure a safe and pleasurable travel experience. This program is self-sustaining, however, fees are very competitive. We are a CERTIFIED YELLOW FEVER IMMUNIZATION approved clinic site. PLEASE CALL [phone] FOR AN APPOINTMENT IN EITHER [place] OR [place].   If you have questions about the services listed above, we want to answer them! Email [place] at: [email or web address] Home Visiting Services for elderly and the disabled are available to residents of [facility] who are in need of care that compares to the care offered by a nursing home, have needs that can be met by the program, and have [place] Medicaid. Other short term services are available to residents 18 years and older who are unable to meet requirements for eligibility to receive services from a certified home health agency, spend the majority of time at home, and need care for six months or less.  PLEASE CALL H548482 OR [phone] FOR MORE INFORMATION. Medication  Assistance Program serves as a link between pharmaceutical companies and patients to provide low cost or free prescription medications. This servce is available for residents who meet certain income  restrictions and have no insurance coverage.  PLEASE CALL [phone] ([place]) OR [phone] ([place]) FOR MORE INFORMATION.  Updated Feb. 21, 2013

## 2015-07-07 NOTE — ED Notes (Signed)
Pt is here with anxiousness, sob, and thinks he is having a reaction to either the percocet or clindamycin he has had twice today. Pt states everyday it gets worse after taking the medication.

## 2016-03-11 ENCOUNTER — Ambulatory Visit (HOSPITAL_COMMUNITY)
Admission: EM | Admit: 2016-03-11 | Discharge: 2016-03-11 | Discharge status: Absent without leave | Payer: Managed Care, Other (non HMO)

## 2016-03-18 ENCOUNTER — Ambulatory Visit (HOSPITAL_COMMUNITY)
Admission: EM | Admit: 2016-03-18 | Discharge: 2016-03-18 | Disposition: A | Discharge status: Home / Self Care | Payer: Managed Care, Other (non HMO) | Attending: Family Medicine | Admitting: Family Medicine

## 2016-03-18 ENCOUNTER — Encounter (HOSPITAL_COMMUNITY): Payer: Self-pay | Admitting: *Deleted

## 2016-03-18 ENCOUNTER — Ambulatory Visit (INDEPENDENT_AMBULATORY_CARE_PROVIDER_SITE_OTHER): Payer: Managed Care, Other (non HMO)

## 2016-03-18 DIAGNOSIS — S8392XA Sprain of unspecified site of left knee, initial encounter: Secondary | ICD-10-CM

## 2016-03-18 MED ORDER — OXYCODONE-ACETAMINOPHEN 5-325 MG PO TABS: 1.0000 | ORAL_TABLET | ORAL | Status: DC | PRN

## 2016-03-18 NOTE — Discharge Instructions (Signed)
Cryotherapy Cryotherapy means treatment with cold. Ice or gel packs can be used to reduce both pain and swelling. Ice is the most helpful within the first 24 to 48 hours after an injury or flare-up from overusing a muscle or joint. Sprains, strains, spasms, burning pain, shooting pain, and aches can all be eased with ice. Ice can also be used when recovering from surgery. Ice is effective, has very few side effects, and is safe for most people to use. PRECAUTIONS  Ice is not a safe treatment option for people with:  Raynaud phenomenon. This is a condition affecting small blood vessels in the extremities. Exposure to cold may cause your problems to return.  Cold hypersensitivity. There are many forms of cold hypersensitivity, including:  Cold urticaria. Red, itchy hives appear on the skin when the tissues begin to warm after being iced.  Cold erythema. This is a red, itchy rash caused by exposure to cold.  Cold hemoglobinuria. Red blood cells break down when the tissues begin to warm after being iced. The hemoglobin that carry oxygen are passed into the urine because they cannot combine with blood proteins fast enough.  Numbness or altered sensitivity in the area being iced. If you have any of the following conditions, do not use ice until you have discussed cryotherapy with your caregiver:  Heart conditions, such as arrhythmia, angina, or chronic heart disease.  High blood pressure.  Healing wounds or open skin in the area being iced.  Current infections.  Rheumatoid arthritis.  Poor circulation.  Diabetes. Ice slows the blood flow in the region it is applied. This is beneficial when trying to stop inflamed tissues from spreading irritating chemicals to surrounding tissues. However, if you expose your skin to cold temperatures for too long or without the proper protection, you can damage your skin or nerves. Watch for signs of skin damage due to cold. HOME CARE INSTRUCTIONS Follow  these tips to use ice and cold packs safely.  Place a dry or damp towel between the ice and skin. A damp towel will cool the skin more quickly, so you may need to shorten the time that the ice is used.  For a more rapid response, add gentle compression to the ice.  Ice for no more than 10 to 20 minutes at a time. The bonier the area you are icing, the less time it will take to get the benefits of ice.  Check your skin after 5 minutes to make sure there are no signs of a poor response to cold or skin damage.  Rest 20 minutes or more between uses.  Once your skin is numb, you can end your treatment. You can test numbness by very lightly touching your skin. The touch should be so light that you do not see the skin dimple from the pressure of your fingertip. When using ice, most people will feel these normal sensations in this order: cold, burning, aching, and numbness.  Do not use ice on someone who cannot communicate their responses to pain, such as small children or people with dementia. HOW TO MAKE AN ICE PACK Ice packs are the most common way to use ice therapy. Other methods include ice massage, ice baths, and cryosprays. Muscle creams that cause a cold, tingly feeling do not offer the same benefits that ice offers and should not be used as a substitute unless recommended by your caregiver. To make an ice pack, do one of the following:  Place crushed ice or a  bag of frozen vegetables in a sealable plastic bag. Squeeze out the excess air. Place this bag inside another plastic bag. Slide the bag into a pillowcase or place a damp towel between your skin and the bag.  Mix 3 parts water with 1 part rubbing alcohol. Freeze the mixture in a sealable plastic bag. When you remove the mixture from the freezer, it will be slushy. Squeeze out the excess air. Place this bag inside another plastic bag. Slide the bag into a pillowcase or place a damp towel between your skin and the bag. SEEK MEDICAL CARE  IF:  You develop white spots on your skin. This may give the skin a blotchy (mottled) appearance.  Your skin turns blue or pale.  Your skin becomes waxy or hard.  Your swelling gets worse. MAKE SURE YOU:   Understand these instructions.  Will watch your condition.  Will get help right away if you are not doing well or get worse.   This information is not intended to replace advice given to you by your health care provider. Make sure you discuss any questions you have with your health care provider.   Document Released: 04/20/2011 Document Revised: 09/14/2014 Document Reviewed: 04/20/2011 Elsevier Interactive Patient Education 2016 [employer] Knee Pain Knee pain is a common problem. It can have many causes. The pain often goes away by following your doctor's home care instructions. Treatment for ongoing pain will depend on the cause of your pain. If your knee pain continues, more tests may be needed to diagnose your condition. Tests may include X-rays or other imaging studies of your knee. HOME CARE  Take medicines only as told by your doctor.  Rest your knee and keep it raised (elevated) while you are resting.  Do not do things that cause pain or make your pain worse.  Avoid activities where both feet leave the ground at the same time, such as running, jumping rope, or doing jumping jacks.  Apply ice to the knee area:  Put ice in a plastic bag.  Place a towel between your skin and the bag.  Leave the ice on for 20 minutes, 2-3 times a day.  Ask your doctor if you should wear an elastic knee support.  Sleep with a pillow under your knee.  Lose weight if you are overweight. Being overweight can make your knee hurt more.  Do not use any tobacco products, including cigarettes, chewing tobacco, or electronic cigarettes. If you need help quitting, ask your doctor. Smoking may slow the healing of any bone and joint problems that you may have. GET HELP IF:  Your knee  pain does not stop, it changes, or it gets worse.  You have a fever along with knee pain.  Your knee gives out or locks up.  Your knee becomes more swollen. GET HELP RIGHT AWAY IF:   Your knee feels hot to the touch.  You have chest pain or trouble breathing.   This information is not intended to replace advice given to you by your health care provider. Make sure you discuss any questions you have with your health care provider.   Document Released: 11/20/2008 Document Revised: 09/14/2014 Document Reviewed: 10/25/2013 Elsevier Interactive Patient Education [employer].
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
## 2016-03-18 NOTE — ED Notes (Signed)
Pt  Reports    He    Twisted    His  l    Knee   And felt   A  Pop -     The       Pt   Has  Pain  And   Some  Swelling         Especially  On  Weight  Bearing

## 2016-09-15 ENCOUNTER — Emergency Department (HOSPITAL_COMMUNITY): Payer: Managed Care, Other (non HMO)

## 2016-09-15 ENCOUNTER — Emergency Department (HOSPITAL_COMMUNITY)
Admission: EM | Admit: 2016-09-15 | Discharge: 2016-09-15 | Disposition: A | Discharge status: Home / Self Care | Payer: Managed Care, Other (non HMO) | Attending: Emergency Medicine | Admitting: Emergency Medicine

## 2016-09-15 ENCOUNTER — Encounter (HOSPITAL_COMMUNITY): Payer: Self-pay | Admitting: Emergency Medicine

## 2016-09-15 DIAGNOSIS — R079 Chest pain, unspecified: Secondary | ICD-10-CM | POA: Insufficient documentation

## 2016-09-15 DIAGNOSIS — Z5321 Procedure and treatment not carried out due to patient leaving prior to being seen by health care provider: Secondary | ICD-10-CM | POA: Insufficient documentation

## 2016-09-15 DIAGNOSIS — R05 Cough: Secondary | ICD-10-CM | POA: Insufficient documentation

## 2016-09-15 LAB — BASIC METABOLIC PANEL
ANION GAP: 6 (ref 5–15)
BUN: 17 mg/dL (ref 6–20)
CALCIUM: 9.2 mg/dL (ref 8.9–10.3)
CO2: 27 mmol/L (ref 22–32)
Chloride: 105 mmol/L (ref 101–111)
Creatinine, Ser: 1.11 mg/dL (ref 0.61–1.24)
GLUCOSE: 109 mg/dL — AB (ref 65–99)
POTASSIUM: 4.9 mmol/L (ref 3.5–5.1)
SODIUM: 138 mmol/L (ref 135–145)

## 2016-09-15 LAB — CBC
HCT: 42.6 % (ref 39.0–52.0)
HEMOGLOBIN: 14.7 g/dL (ref 13.0–17.0)
MCH: 29.6 pg (ref 26.0–34.0)
MCHC: 34.5 g/dL (ref 30.0–36.0)
MCV: 85.9 fL (ref 78.0–100.0)
Platelets: 214 10*3/uL (ref 150–400)
RBC: 4.96 MIL/uL (ref 4.22–5.81)
RDW: 14.3 % (ref 11.5–15.5)
WBC: 13 10*3/uL — ABNORMAL HIGH (ref 4.0–10.5)

## 2016-09-15 LAB — I-STAT TROPONIN, ED: TROPONIN I, POC: 0.01 ng/mL (ref 0.00–0.08)

## 2016-09-15 NOTE — ED Notes (Signed)
Called pt for reasses vitals no answer.

## 2016-09-15 NOTE — ED Triage Notes (Signed)
Pt reports productive cough x 1 month , starts sharp chest pain today. Denies shortness of breath.

## 2016-09-15 NOTE — ED Notes (Signed)
Pt request std check info PT MD will have to place order. **three gold tube in lab**

## 2016-09-16 ENCOUNTER — Emergency Department (HOSPITAL_COMMUNITY)
Admission: EM | Admit: 2016-09-16 | Discharge: 2016-09-16 | Disposition: A | Discharge status: Home / Self Care | Payer: Managed Care, Other (non HMO) | Attending: Emergency Medicine | Admitting: Emergency Medicine

## 2016-09-16 DIAGNOSIS — F17290 Nicotine dependence, other tobacco product, uncomplicated: Secondary | ICD-10-CM | POA: Insufficient documentation

## 2016-09-16 DIAGNOSIS — J40 Bronchitis, not specified as acute or chronic: Secondary | ICD-10-CM

## 2016-09-16 MED ORDER — BENZONATATE 100 MG PO CAPS: 100.0000 mg | ORAL_CAPSULE | Freq: Three times a day (TID) | ORAL | 0 refills | Status: DC

## 2016-09-16 MED ORDER — AZITHROMYCIN 250 MG PO TABS: 250.0000 mg | ORAL_TABLET | Freq: Every day | ORAL | 0 refills | Status: DC
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
## 2016-09-16 NOTE — Discharge Instructions (Signed)
Return if any problems.  See Dr. Berdine Addison for recheck if not improved in 1 week

## 2016-09-16 NOTE — ED Triage Notes (Addendum)
Pt reports chest pain that started yesterday  and productive cough for 1 month. Pt reports SOB as well. Pt was triaged and had labs drawn at [facility] last night but left due to wait

## 2016-09-16 NOTE — Discharge Planning (Signed)
Pt up for discharge. EDCM reviewed chart for possible CM needs.  No needs identified or communicated.  

## 2016-12-23 ENCOUNTER — Ambulatory Visit (HOSPITAL_COMMUNITY)
Admission: EM | Admit: 2016-12-23 | Discharge: 2016-12-23 | Disposition: A | Discharge status: Home / Self Care | Payer: Managed Care, Other (non HMO) | Attending: Family Medicine | Admitting: Family Medicine

## 2016-12-23 ENCOUNTER — Encounter (HOSPITAL_COMMUNITY): Payer: Self-pay | Admitting: Emergency Medicine

## 2016-12-23 DIAGNOSIS — T148XXA Other injury of unspecified body region, initial encounter: Secondary | ICD-10-CM

## 2016-12-23 DIAGNOSIS — Z23 Encounter for immunization: Secondary | ICD-10-CM

## 2016-12-23 DIAGNOSIS — S91331A Puncture wound without foreign body, right foot, initial encounter: Secondary | ICD-10-CM

## 2016-12-23 DIAGNOSIS — W450XXA Nail entering through skin, initial encounter: Secondary | ICD-10-CM

## 2016-12-23 MED ORDER — TETANUS-DIPHTH-ACELL PERTUSSIS 5-2.5-18.5 LF-MCG/0.5 IM SUSP
INTRAMUSCULAR | Status: AC
  Filled 2016-12-23: qty 0.5

## 2016-12-23 MED ORDER — TETANUS-DIPHTH-ACELL PERTUSSIS 5-2.5-18.5 LF-MCG/0.5 IM SUSP
0.5000 mL | Freq: Once | INTRAMUSCULAR | Status: AC
  Administered 2016-12-23: 0.5 mL via INTRAMUSCULAR
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
## 2016-12-23 NOTE — Discharge Instructions (Signed)
Keep the wound clean and dry. Otherwise for the next couple days she may soak her foot in some warm salty water and clean with soap and water and then dry it. Watch for any signs of infection. Read the instructions on your puncture wound information sheet. It appears that your developing an infection seek medical treatment promptly. Do not wait to see if it gets better. You have received a T dap immunization today.
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
## 2016-12-23 NOTE — ED Notes (Signed)
Puncture wound cleaned with a betadine scrub and covered with a band aid.  Pt was taught how to care for the wound and stated understanding.

## 2017-01-18 ENCOUNTER — Ambulatory Visit (HOSPITAL_COMMUNITY): Admission: EM | Admit: 2017-01-18 | Discharge: 2017-01-18 | Discharge status: Against Medical Advice | Payer: Self-pay

## 2017-01-18 ENCOUNTER — Emergency Department (HOSPITAL_COMMUNITY)
Admission: EM | Admit: 2017-01-18 | Discharge: 2017-01-18 | Disposition: A | Discharge status: Home / Self Care | Payer: Self-pay | Attending: Dermatology | Admitting: Dermatology

## 2017-01-18 ENCOUNTER — Encounter (HOSPITAL_COMMUNITY): Payer: Self-pay | Admitting: Emergency Medicine

## 2017-01-18 DIAGNOSIS — R200 Anesthesia of skin: Secondary | ICD-10-CM | POA: Insufficient documentation

## 2017-01-18 DIAGNOSIS — Z5321 Procedure and treatment not carried out due to patient leaving prior to being seen by health care provider: Secondary | ICD-10-CM | POA: Insufficient documentation
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
## 2017-01-19 ENCOUNTER — Encounter (HOSPITAL_COMMUNITY): Payer: Self-pay

## 2017-01-19 ENCOUNTER — Emergency Department (HOSPITAL_COMMUNITY): Payer: Self-pay

## 2017-01-19 ENCOUNTER — Other Ambulatory Visit: Payer: Self-pay

## 2017-01-19 ENCOUNTER — Emergency Department (HOSPITAL_COMMUNITY)
Admission: EM | Admit: 2017-01-19 | Discharge: 2017-01-19 | Disposition: A | Discharge status: Home / Self Care | Payer: Self-pay | Attending: Emergency Medicine | Admitting: Emergency Medicine

## 2017-01-19 DIAGNOSIS — R202 Paresthesia of skin: Secondary | ICD-10-CM | POA: Insufficient documentation

## 2017-01-19 DIAGNOSIS — F17290 Nicotine dependence, other tobacco product, uncomplicated: Secondary | ICD-10-CM | POA: Insufficient documentation

## 2017-01-19 DIAGNOSIS — R200 Anesthesia of skin: Secondary | ICD-10-CM | POA: Insufficient documentation

## 2017-01-19 LAB — BASIC METABOLIC PANEL
ANION GAP: 7 (ref 5–15)
BUN: 10 mg/dL (ref 6–20)
CHLORIDE: 105 mmol/L (ref 101–111)
CO2: 26 mmol/L (ref 22–32)
Calcium: 9 mg/dL (ref 8.9–10.3)
Creatinine, Ser: 0.97 mg/dL (ref 0.61–1.24)
GFR calc non Af Amer: >60 mL/min (ref >60)
Glucose, Bld: 83 mg/dL (ref 65–99)
POTASSIUM: 3.7 mmol/L (ref 3.5–5.1)
SODIUM: 138 mmol/L (ref 135–145)

## 2017-01-19 LAB — CBC
HEMATOCRIT: 42.4 % (ref 39.0–52.0)
HEMOGLOBIN: 14.5 g/dL (ref 13.0–17.0)
MCH: 29.8 pg (ref 26.0–34.0)
MCHC: 34.2 g/dL (ref 30.0–36.0)
MCV: 87.2 fL (ref 78.0–100.0)
Platelets: 196 10*3/uL (ref 150–400)
RBC: 4.86 MIL/uL (ref 4.22–5.81)
RDW: 14 % (ref 11.5–15.5)
WBC: 12.1 10*3/uL — AB (ref 4.0–10.5)

## 2017-01-19 LAB — I-STAT TROPONIN, ED: TROPONIN I, POC: 0.01 ng/mL (ref 0.00–0.08)
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
## 2017-01-19 NOTE — ED Notes (Signed)
Patient denies chest pain, problems eating, chewing, or swallowing.  He is A & O x4.

## 2017-01-19 NOTE — ED Triage Notes (Signed)
Patient c/o left arm numbness yesterday. Patient also c/o left facial tingling/numbness of the left face that started at 1100. Patient denies any difficulty swallowing or talking. Patient also c/o light headedness since 100 today.  Patient states he came to the ED yesterday for the same and then left due to long wait times.

## 2017-01-19 NOTE — ED Notes (Signed)
Bed: HU83 Expected date:  Expected time:  Means of arrival:  Comments: 41 yo f nv

## 2017-01-19 NOTE — Discharge Instructions (Signed)
Please follow up with her primary care provider tomorrow regarding today's visit. Please also see a neurologist if symptoms continue or worsen.   Get help right away if: You feel weak. You have trouble walking or moving. You have problems with speech, understanding, or vision. You feel confused. You cannot control your bladder or bowel movements. You have numbness after an injury. You faint.

## 2017-01-25 ENCOUNTER — Telehealth: Payer: Self-pay | Admitting: Neurology

## 2017-01-25 ENCOUNTER — Encounter (INDEPENDENT_AMBULATORY_CARE_PROVIDER_SITE_OTHER): Payer: Self-pay

## 2017-01-25 ENCOUNTER — Encounter: Payer: Self-pay | Admitting: Neurology

## 2017-01-25 ENCOUNTER — Ambulatory Visit (INDEPENDENT_AMBULATORY_CARE_PROVIDER_SITE_OTHER): Payer: PRIVATE HEALTH INSURANCE | Admitting: Neurology

## 2017-01-25 DIAGNOSIS — Z87828 Personal history of other (healed) physical injury and trauma: Secondary | ICD-10-CM | POA: Diagnosis not present

## 2017-01-25 DIAGNOSIS — G5602 Carpal tunnel syndrome, left upper limb: Secondary | ICD-10-CM

## 2017-01-25 DIAGNOSIS — G5600 Carpal tunnel syndrome, unspecified upper limb: Secondary | ICD-10-CM | POA: Insufficient documentation

## 2017-01-25 DIAGNOSIS — R200 Anesthesia of skin: Secondary | ICD-10-CM | POA: Diagnosis not present

## 2017-01-25 MED ORDER — MELOXICAM 15 MG PO TABS: 15.0000 mg | ORAL_TABLET | Freq: Every day | ORAL | 3 refills | Status: DC

## 2017-01-25 MED ORDER — WRIST SPLINT/COCK-UP/LEFT L MISC: 0 refills | Status: DC

## 2017-01-25 NOTE — Telephone Encounter (Signed)
LMOM for pt. to call--we can get EMG r/s for him and, as appt. was only made a couple of hrs. ago, cancel it and not charge him the [employer].  He was seen for the first time today--out EMG templates changed--RAS and Beau both happened to have appt's open tomorrw, then after that, RAS is booked until June.  He made appt. for EMG under some strain since next appt. is some ways out.  RAS is in agreement that pt. should not be charged a no show fee/fim

## 2017-01-25 NOTE — Telephone Encounter (Signed)
Pt is now calling back in and is stating he is unable to miss time from work and is wanting to see what can be done to avoid him having to pay the $50.00 no show fee that he has been made aware of if this appointment is not kept.

## 2017-01-25 NOTE — Patient Instructions (Signed)
On exam, you have a mild weakness in the left thumb that could be related to carpal tunnel syndrome. You also had mild numbness higher up in the arm and mild facial numbness.     Please wear the wrist splint at bedtime on the left wrist.  We will check a head CT scan and a nerve conduction/EMG study to better determine the etiology of the symptoms.  I would like you to have light duties at work until these tests are performed.
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
## 2017-01-26 ENCOUNTER — Encounter: Payer: Self-pay | Admitting: Neurology

## 2017-02-02 ENCOUNTER — Other Ambulatory Visit: Payer: Self-pay

## 2017-02-08 ENCOUNTER — Ambulatory Visit
Admission: RE | Admit: 2017-02-08 | Discharge: 2017-02-08 | Disposition: A | Discharge status: Home / Self Care | Payer: 59 | Source: Ambulatory Visit | Attending: Neurology | Admitting: Neurology

## 2017-02-08 DIAGNOSIS — R200 Anesthesia of skin: Secondary | ICD-10-CM | POA: Diagnosis not present

## 2017-02-08 DIAGNOSIS — Z87828 Personal history of other (healed) physical injury and trauma: Secondary | ICD-10-CM

## 2017-02-09 ENCOUNTER — Ambulatory Visit (INDEPENDENT_AMBULATORY_CARE_PROVIDER_SITE_OTHER): Payer: PRIVATE HEALTH INSURANCE | Admitting: Neurology

## 2017-02-09 ENCOUNTER — Ambulatory Visit (INDEPENDENT_AMBULATORY_CARE_PROVIDER_SITE_OTHER): Payer: Self-pay | Admitting: Neurology

## 2017-02-09 DIAGNOSIS — R200 Anesthesia of skin: Secondary | ICD-10-CM

## 2017-02-09 DIAGNOSIS — Z0289 Encounter for other administrative examinations: Secondary | ICD-10-CM

## 2017-02-09 DIAGNOSIS — G5602 Carpal tunnel syndrome, left upper limb: Secondary | ICD-10-CM

## 2017-02-09 NOTE — Progress Notes (Signed)
Full Name: Andrew Escobar Gender: Male MRN #: [id number] Date of Birth: February 05, 1976    Visit Date: 02/09/2017 09:14 Age: 41 Years 72 Months Old Examining Physician: Arlice Colt, MD  Referring Physician: Felecia Shelling, MD    History: Mr. Andrew Escobar is a 41 year old man with numbness and tingling in the left arm.  Nerve conduction studies: The left median, ulnar and radial motor responses had normal distal latencies, conduction velocities and amplitudes.  The left median, or nor and radial sensory responses had normal peak latencies and amplitudes.  EMG: Needle EMG of the left deltoid, triceps, biceps, pronator teres, EDC and first dorsal interosseous muscles showed normal motor unit action potentials and recruitment. There was no abnormal spontaneous activity.  Conclusion: This is a normal nerve conduction and EMG study of the left arm.    Aniel Hubble A. Felecia Shelling, MD, PhD, FAAN Certified in Neurology, Clinical Neurophysiology, Sleep Medicine, Pain Medicine and Neuroimaging Director, [facility] [address] [place], [place] [address] [phone]         [facility]    Nerve / Sites Rec. Site Latency Ref. Amplitude Ref. Rel Amp Segments Distance Velocity Ref. Area    ms ms mV mV %  cm m/s m/s mVms  L Median - APB     Wrist APB 3.3 ?4.4 10.0 ?4.0 100 Wrist - APB 7   29.0     Upper arm APB 7.9  9.7  97.1 Upper arm - Wrist 28 62 ?49 28.1  L Ulnar - ADM     Wrist ADM 2.7 ?3.3 11.8 ?6.0 100 Wrist - ADM 7   33.3     B.Elbow ADM 6.6  10.0  84.7 B.Elbow - Wrist 24 61 ?49 31.3     A.Elbow ADM 8.7  10.3  103 A.Elbow - B.Elbow 12 58 ?49 31.7         A.Elbow - Wrist      L Radial - EIP     Forearm EIP 2.4 ?2.9 8.1 ?2.0 100 Forearm - EIP 4  ?49 34.2     Elbow EIP 5.7  9.9  122 Elbow - Forearm 18 56  52.2     Spiral Gr EIP 7.5  10.2  104 Spiral Gr - Elbow 12 66  40.9     Axilla EIP 9.3  11.3  111 Axilla -  Spiral Gr 12 66  48.2           SNC    Nerve / Sites Rec. Site Peak Lat Ref.  Amp Ref. Segments Distance    ms ms V V  cm  L Radial - Anatomical snuff box (Forearm)     Forearm Wrist 2.45 ?2.90 23 ?15 Forearm - Wrist 10  L Median - Orthodromic (Dig II, Mid palm)     Dig II Wrist 2.92 ?3.40 30 ?10 Dig II - Wrist 13  L Ulnar - Orthodromic, (Dig V, Mid palm)     Dig V Wrist 2.60 ?3.10 11 ?5 Dig V - Wrist 56           F  Wave    Nerve F Lat Ref.   ms ms  L Ulnar - ADM 29.5 ?32.0       EMG full       EMG Summary Table    Spontaneous MUAP Recruitment  Muscle IA Fib PSW Fasc Other Amp Dur. Poly Pattern  L. Deltoid  Normal None None None _______ Normal Normal Normal Normal  L. Triceps brachii Normal None None None _______ Normal Normal Normal Normal  L. Biceps brachii Normal None None None _______ Normal Normal Normal Normal  L. Extensor digitorum communis Normal None None None _______ Normal Normal Normal Normal  L. Pronator teres Normal None None None _______ Normal Normal Normal Normal  L. First dorsal interosseous Normal None None None _______ Normal Normal Normal Normal

## 2018-01-12 IMAGING — CR DG CHEST 2V
2 series · 2 of 2 positions shown · non-contrast
Comparison: Chest x-ray of September 15, 2016

CLINICAL DATA: Left arm numbness for the past day. No current chest
complaints. History of smoking.

EXAM:
CHEST  2 VIEW

[w chest pa]
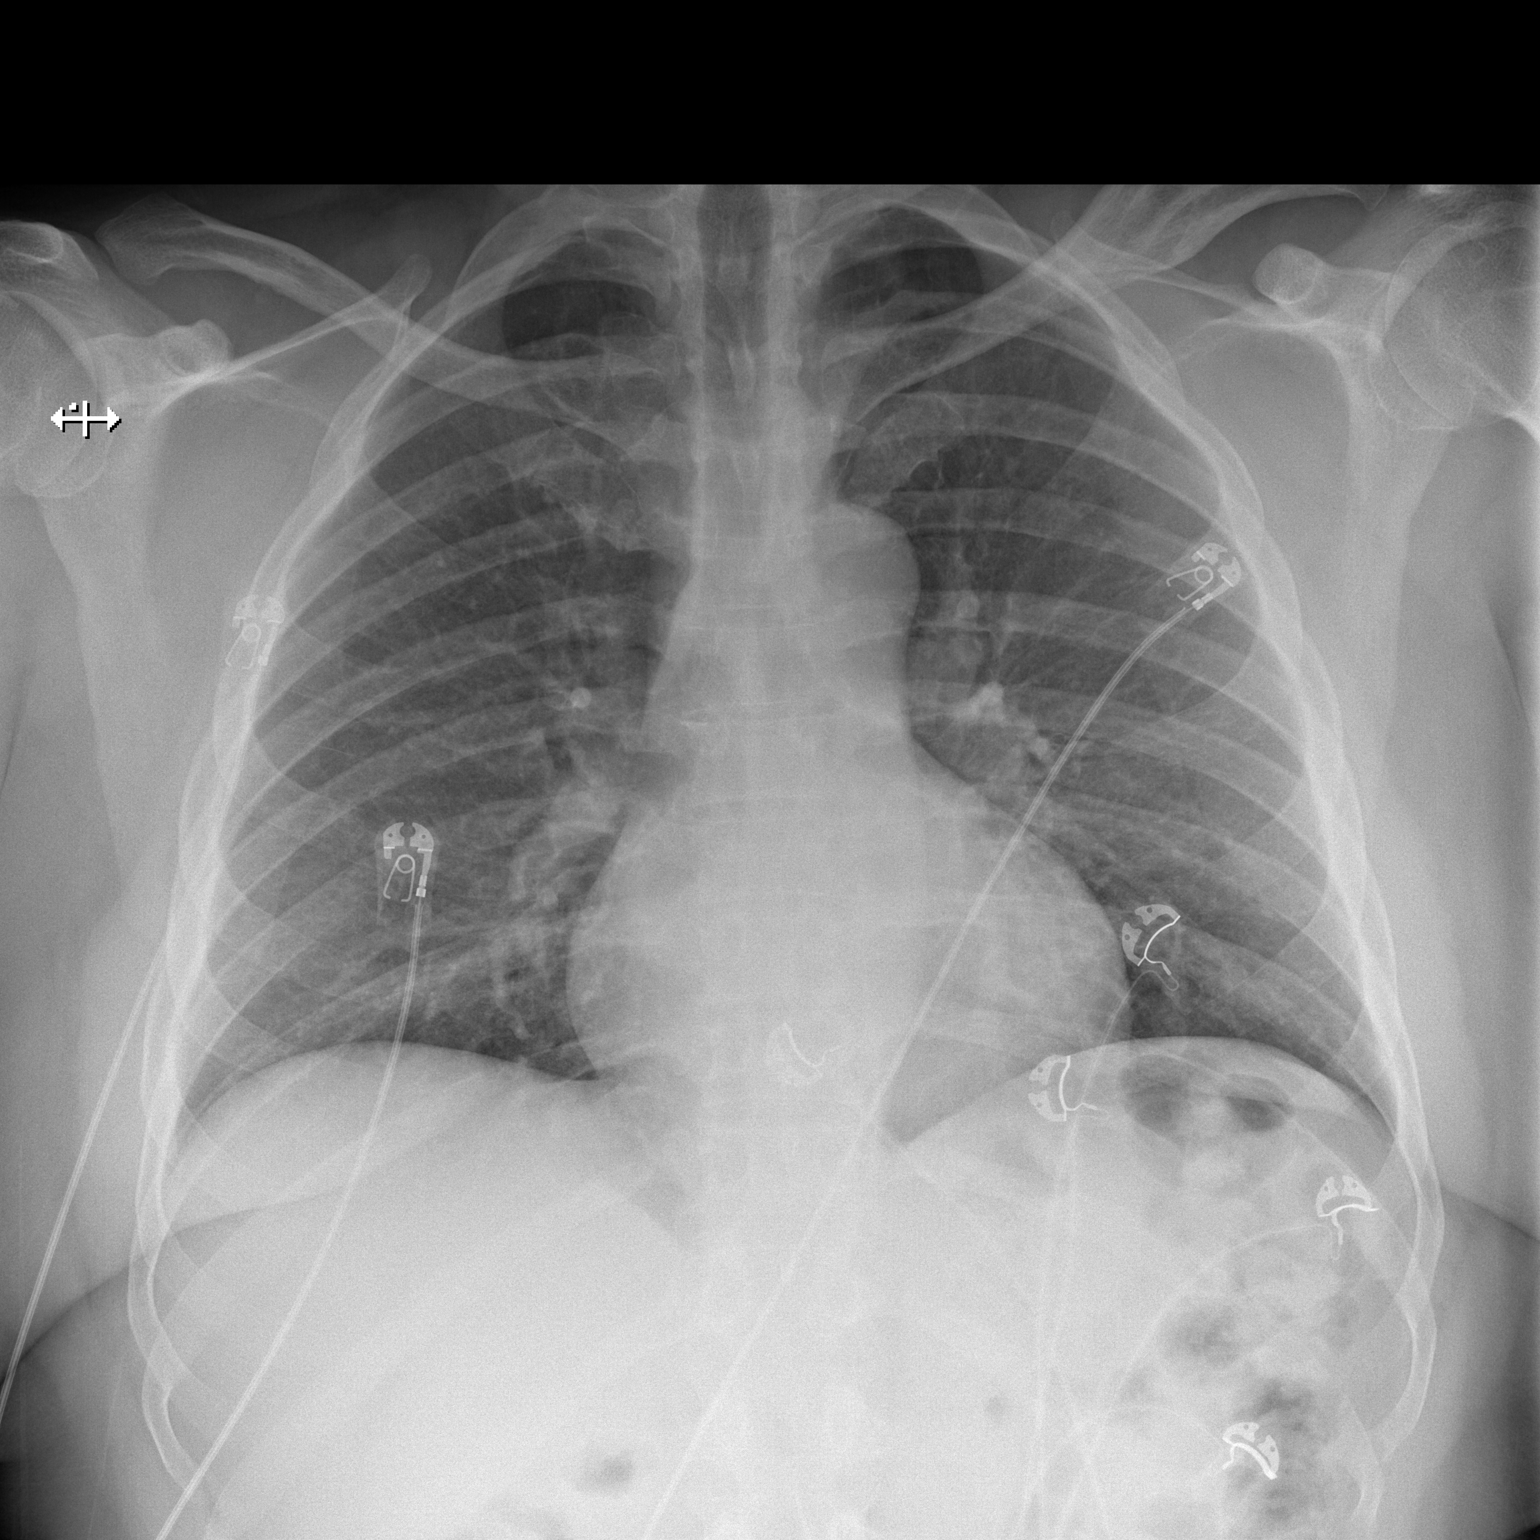

[w chest lat]
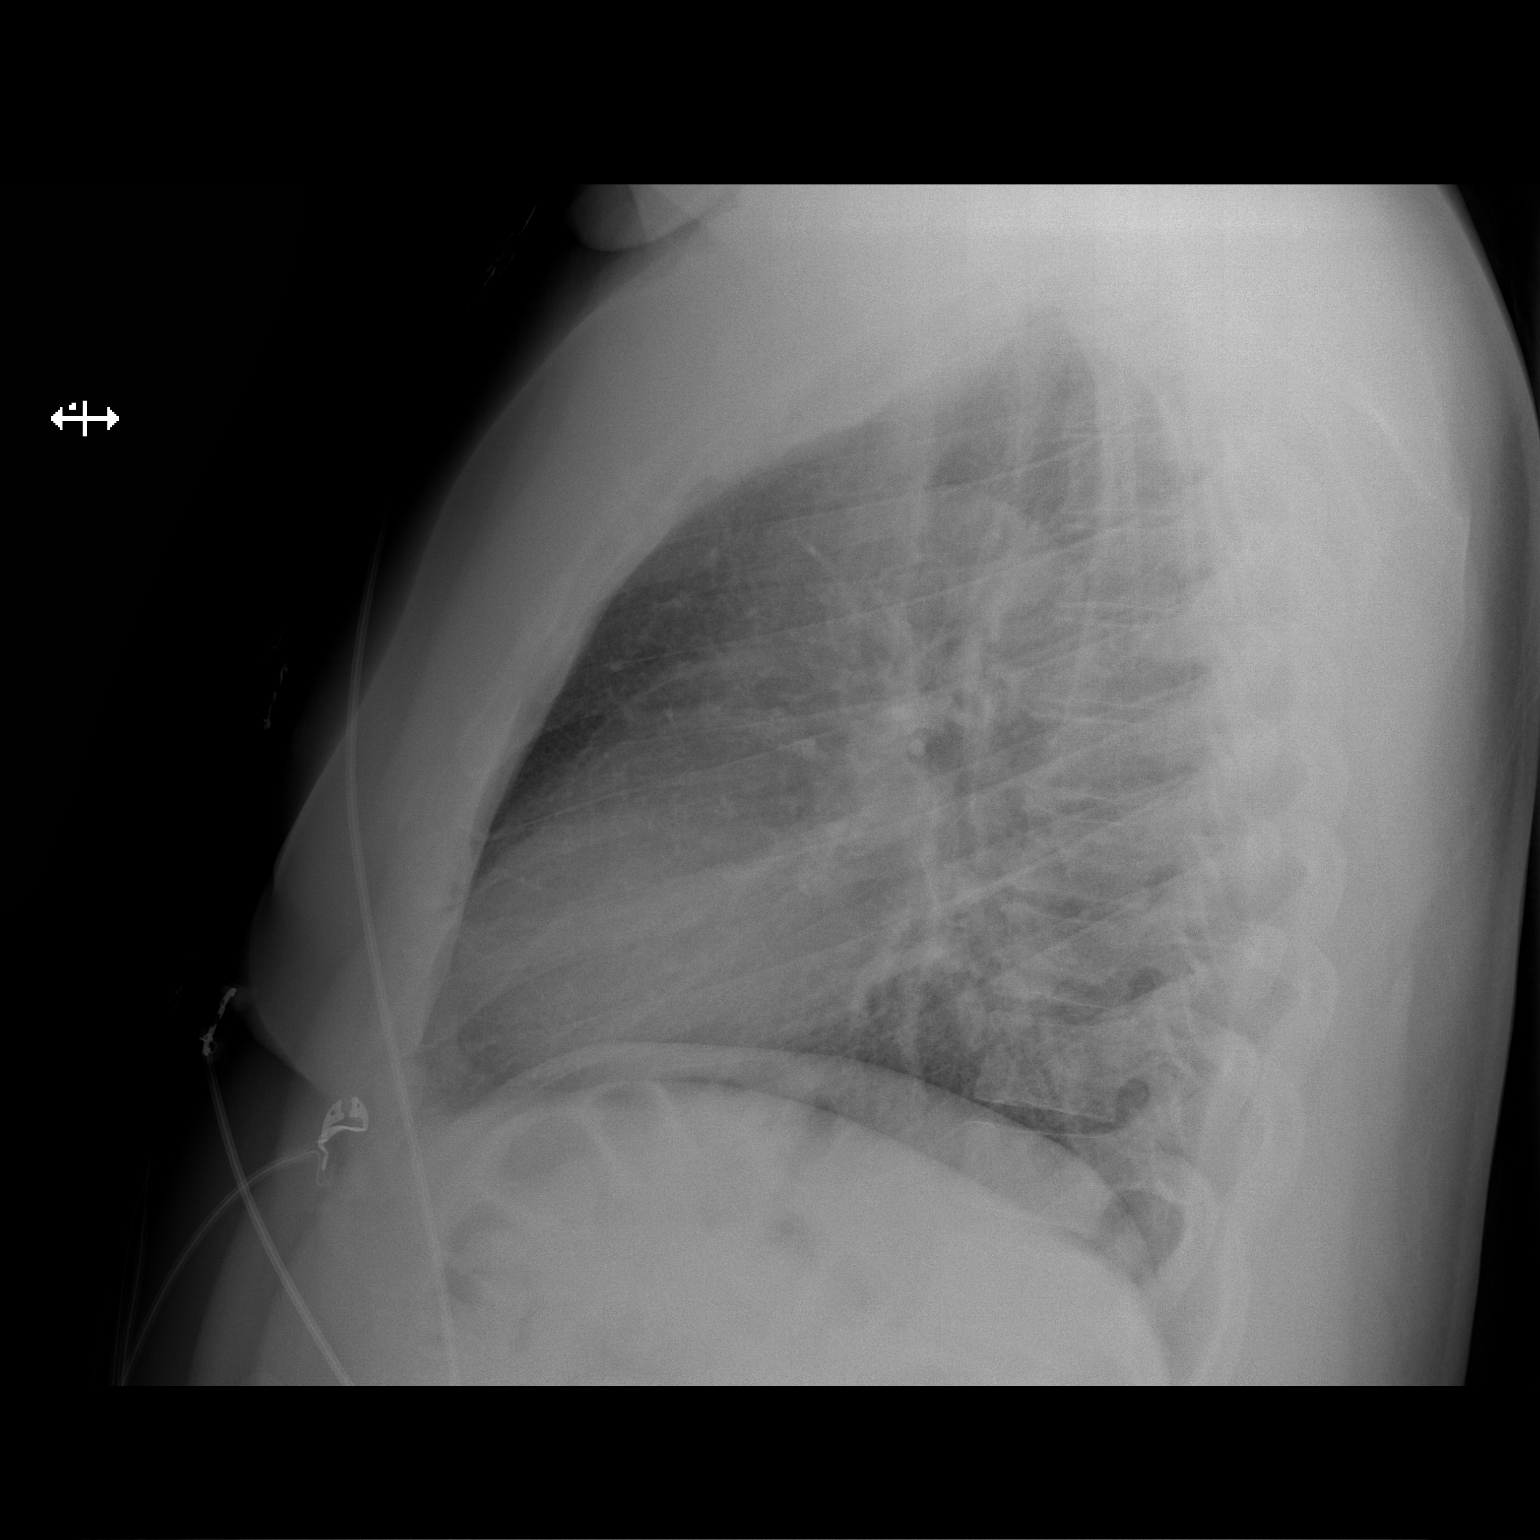

[2 of 2 positions shown; findings below may reference images not displayed]

FINDINGS: The lungs are adequately inflated. There is no focal infiltrate.
There is no pleural effusion. The heart and pulmonary vascularity
are normal. The mediastinum is normal in width. The bony thorax is
unremarkable.
IMPRESSION: There is no pneumonia, CHF, nor other acute cardiopulmonary
abnormality.

## 2018-01-27 ENCOUNTER — Ambulatory Visit (INDEPENDENT_AMBULATORY_CARE_PROVIDER_SITE_OTHER): Payer: BLUE CROSS/BLUE SHIELD | Admitting: Medical

## 2018-01-27 ENCOUNTER — Encounter: Payer: Self-pay | Admitting: Medical

## 2018-01-27 VITALS — BP 154/92 | HR 73 | Temp 97.9°F | Ht 73.0 in | Wt 280.0 lb

## 2018-01-27 DIAGNOSIS — Z8739 Personal history of other diseases of the musculoskeletal system and connective tissue: Secondary | ICD-10-CM | POA: Insufficient documentation

## 2018-01-27 DIAGNOSIS — N401 Enlarged prostate with lower urinary tract symptoms: Secondary | ICD-10-CM | POA: Diagnosis not present

## 2018-01-27 DIAGNOSIS — K4090 Unilateral inguinal hernia, without obstruction or gangrene, not specified as recurrent: Secondary | ICD-10-CM

## 2018-01-27 DIAGNOSIS — Z139 Encounter for screening, unspecified: Secondary | ICD-10-CM

## 2018-01-27 DIAGNOSIS — Z113 Encounter for screening for infections with a predominantly sexual mode of transmission: Secondary | ICD-10-CM | POA: Diagnosis not present

## 2018-01-27 DIAGNOSIS — Z87828 Personal history of other (healed) physical injury and trauma: Secondary | ICD-10-CM | POA: Diagnosis not present

## 2018-01-27 DIAGNOSIS — E785 Hyperlipidemia, unspecified: Secondary | ICD-10-CM | POA: Diagnosis not present

## 2018-01-27 DIAGNOSIS — I10 Essential (primary) hypertension: Secondary | ICD-10-CM | POA: Diagnosis not present

## 2018-01-27 DIAGNOSIS — F17200 Nicotine dependence, unspecified, uncomplicated: Secondary | ICD-10-CM | POA: Insufficient documentation

## 2018-01-27 DIAGNOSIS — E669 Obesity, unspecified: Secondary | ICD-10-CM

## 2018-01-27 DIAGNOSIS — Z0000 Encounter for general adult medical examination without abnormal findings: Secondary | ICD-10-CM

## 2018-01-27 DIAGNOSIS — Z125 Encounter for screening for malignant neoplasm of prostate: Secondary | ICD-10-CM

## 2018-01-27 LAB — POCT URINALYSIS DIP (PROADVANTAGE DEVICE)
BILIRUBIN UA: NEGATIVE
Glucose, UA: NEGATIVE mg/dL
LEUKOCYTES UA: NEGATIVE
Nitrite, UA: NEGATIVE
PH UA: 6.5 (ref 5.0–8.0)

## 2018-01-27 NOTE — Patient Instructions (Addendum)
Thanks for trusting [place] with your health care and for coming in for a physical today.  Below are some general recommendations I have for you:  Yearly screenings See your eye doctor yearly for routine vision care. See your dentist yearly for routine dental care including hygiene visits twice yearly. See me here yearly for a routine physical and preventative care visit   Specific Concerns today:  . Pending labs, I will likely start you on a medication that can help both blood pressure and prostate symptoms . Continue efforts to lose weight, eat a healthy low fat diet   Please follow up yearly for a physical.   Hernia A hernia happens when an organ or tissue inside your body pushes out through a weak spot in the belly (abdomen). Follow these instructions at home:  Avoid stretching or overusing (straining) the muscles near the hernia.  Do not lift anything heavier than 10 lb (4.5 kg).  Use the muscles in your leg when you lift something up. Do not use the muscles in your back.  When you cough, try to cough gently.  Eat a diet that has a lot of fiber. Eat lots of fruits and vegetables.  Drink enough fluids to keep your pee (urine) clear or pale yellow. Try to drink 6-8 glasses of water a day.  Take medicines to make your poop soft (stool softeners) as told by your doctor.  Lose weight, if you are overweight.  Do not use any tobacco products, including cigarettes, chewing tobacco, or electronic cigarettes. If you need help quitting, ask your doctor.  Keep all follow-up visits as told by your doctor. This is important. Contact a doctor if:  The skin by the hernia gets puffy (swollen) or red.  The hernia is painful. Get help right away if:  You have a fever.  You have belly pain that is getting worse.  You feel sick to your stomach (nauseous) or you throw up (vomit).  You cannot push the hernia back in place by gently pressing on it while you are lying down.  The  hernia: ? Changes in shape or size. ? Is stuck outside your belly. ? Changes color. ? Feels hard or tender. This information is not intended to replace advice given to you by your health care provider. Make sure you discuss any questions you have with your health care provider. Document Released: 02/11/2010 Document Revised: 01/30/2016 Document Reviewed: 07/04/2014 Elsevier Interactive Patient Education  2018 [place] for Adults - Male      [place]:  A routine yearly physical is a good way to check in with your primary care provider about your health and preventive screening. It is also an opportunity to share updates about your health and any concerns you have, and receive a thorough all-over exam.   Most health insurance companies pay for at least some preventative services.  Check with your health plan for specific coverages.  WHAT PREVENTATIVE SERVICES DO WOMEN NEED?  Adult men should have their weight and blood pressure checked regularly.   Men age 43 and older should have their cholesterol levels checked regularly.  Beginning at age 66 and continuing to age 6, men should be screened for colorectal cancer.  Certain people may need continued testing until age 84.  Updating vaccinations is part of preventative care.  Vaccinations help protect against diseases such as the flu.  Osteoporosis is a disease in which the bones lose minerals  and strength as we age. Men ages 53 and over should discuss this with their caregivers  Lab tests are generally done as part of preventative care to screen for anemia and blood disorders, to screen for problems with the kidneys and liver, to screen for bladder problems, to check blood sugar, and to check your cholesterol level.  Preventative services generally include counseling about diet, exercise, avoiding tobacco, drugs, excessive alcohol consumption, and sexually transmitted infections.     GENERAL RECOMMENDATIONS FOR GOOD HEALTH:  Healthy diet:  Eat a variety of foods, including fruit, vegetables, animal or vegetable protein, such as meat, fish, chicken, and eggs, or beans, lentils, tofu, and grains, such as rice.  Drink plenty of water daily.  Decrease saturated fat in the diet, avoid lots of red meat, processed foods, sweets, fast foods, and fried foods.  Exercise:  Aerobic exercise helps maintain good heart health. At least 30-40 minutes of moderate-intensity exercise is recommended. For example, a brisk walk that increases your heart rate and breathing. This should be done on most days of the week.   Find a type of exercise or a variety of exercises that you enjoy so that it becomes a part of your daily life.  Examples are running, walking, swimming, water aerobics, and biking.  For motivation and support, explore group exercise such as aerobic class, spin class, Zumba, Yoga,or  martial arts, etc.    Set exercise goals for yourself, such as a certain weight goal, walk or run in a race such as a 5k walk/run.  Speak to your primary care provider about exercise goals.  Disease prevention:  If you smoke or chew tobacco, find out from your caregiver how to quit. It can literally save your life, no matter how long you have been a tobacco user. If you do not use tobacco, never begin.   Maintain a healthy diet and normal weight. Increased weight leads to problems with blood pressure and diabetes.   The Body Mass Index or BMI is a way of measuring how much of your body is fat. Having a BMI above 27 increases the risk of heart disease, diabetes, hypertension, stroke and other problems related to obesity. Your caregiver can help determine your BMI and based on it develop an exercise and dietary program to help you achieve or maintain this important measurement at a healthful level.  High blood pressure causes heart and blood vessel problems.  Persistent high blood pressure  should be treated with medicine if weight loss and exercise do not work.   Fat and cholesterol leaves deposits in your arteries that can block them. This causes heart disease and vessel disease elsewhere in your body.  If your cholesterol is found to be high, or if you have heart disease or certain other medical conditions, then you may need to have your cholesterol monitored frequently and be treated with medication.   Ask if you should have a cardiac stress test if your history suggests this. A stress test is a test done on a treadmill that looks for heart disease. This test can find disease prior to there being a problem.  Osteoporosis is a disease in which the bones lose minerals and strength as we age. This can result in serious bone fractures. Risk of osteoporosis can be identified using a bone density scan. Men ages 42 and over should discuss this with their caregivers. Ask your caregiver whether you should be taking a calcium supplement and Vitamin D, to reduce the rate  of osteoporosis.   Avoid drinking alcohol in excess (more than two drinks per day).  Avoid use of street drugs. Do not share needles with anyone. Ask for professional help if you need assistance or instructions on stopping the use of alcohol, cigarettes, and/or drugs.  Brush your teeth twice a day with fluoride toothpaste, and floss once a day. Good oral hygiene prevents tooth decay and gum disease. The problems can be painful, unattractive, and can cause other health problems. Visit your dentist for a routine oral and dental check up and preventive care every 6-12 months.   Look at your skin regularly.  Use a mirror to look at your back. Notify your caregivers of changes in moles, especially if there are changes in shapes, colors, a size larger than a pencil eraser, an irregular border, or development of new moles.  Safety:  Use seatbelts 100% of the time, whether driving or as a passenger.  Use safety devices such as hearing  protection if you work in environments with loud noise or significant background noise.  Use safety glasses when doing any work that could send debris in to the eyes.  Use a helmet if you ride a bike or motorcycle.  Use appropriate safety gear for contact sports.  Talk to your caregiver about gun safety.  Use sunscreen with a SPF (or skin protection factor) of 15 or greater.  Lighter skinned people are at a greater risk of skin cancer. Don't forget to also wear sunglasses in order to protect your eyes from too much damaging sunlight. Damaging sunlight can accelerate cataract formation.   Practice safe sex. Use condoms. Condoms are used for birth control and to help reduce the spread of sexually transmitted infections (or STIs).  Some of the STIs are gonorrhea (the clap), chlamydia, syphilis, trichomonas, herpes, HPV (human papilloma virus) and HIV (human immunodeficiency virus) which causes AIDS. The herpes, HIV and HPV are viral illnesses that have no cure. These can result in disability, cancer and death.   Keep carbon monoxide and smoke detectors in your home functioning at all times. Change the batteries every 6 months or use a model that plugs into the wall.   Vaccinations:  Stay up to date with your tetanus shots and other required immunizations. You should have a booster for tetanus every 10 years. Be sure to get your flu shot every year, since 5%-20% of the [place] population comes down with the flu. The flu vaccine changes each year, so being vaccinated once is not enough. Get your shot in the fall, before the flu season peaks.   Other vaccines to consider:  Human Papilloma Virus or HPV causes cancer of the cervix, and other infections that can be transmitted from person to person. There is a vaccine for HPV, and males should get immunized between the ages of 50 and 77. It requires a series of 3 shots.   Pneumococcal vaccine to protect against certain types of pneumonia.  This is normally  recommended for adults age 37 or older.  However, adults younger than 44 years old with certain underlying conditions such as diabetes, heart or lung disease should also receive the vaccine.  Shingles vaccine to protect against Varicella Zoster if you are older than age 23, or younger than 44 years old with certain underlying illness.  If you have not had the Shingrix vaccine, please call your insurer to inquire about coverage for the Shingrix vaccine given in 2 doses.   Some insurers cover this vaccine  after age 20, some cover this after age 16.  If your insurer covers this, then call to schedule appointment to have this vaccine here  Hepatitis A vaccine to protect against a form of infection of the liver by a virus acquired from food.  Hepatitis B vaccine to protect against a form of infection of the liver by a virus acquired from blood or body fluids, particularly if you work in health care.  If you plan to travel internationally, check with your local health department for specific vaccination recommendations.   What should I know about cancer screening? Many types of cancers can be detected early and may often be prevented. Lung Cancer  You should be screened every year for lung cancer if: ? You are a current smoker who has smoked for at least 30 years. ? You are a former smoker who has quit within the past 15 years.  Talk to your health care provider about your screening options, when you should start screening, and how often you should be screened.  Colorectal Cancer  Routine colorectal cancer screening usually begins at 44 years of age and should be repeated every 5-10 years until you are 44 years old. You may need to be screened more often if early forms of precancerous polyps or small growths are found. Your health care provider may recommend screening at an earlier age if you have risk factors for colon cancer.  Your health care provider may recommend using home test kits to check  for hidden blood in the stool.  A small camera at the end of a tube can be used to examine your colon (sigmoidoscopy or colonoscopy). This checks for the earliest forms of colorectal cancer.  Prostate and Testicular Cancer  Depending on your age and overall health, your health care provider may do certain tests to screen for prostate and testicular cancer.  Talk to your health care provider about any symptoms or concerns you have about testicular or prostate cancer.  Skin Cancer  Check your skin from head to toe regularly.  Tell your health care provider about any new moles or changes in moles, especially if: ? There is a change in a mole's size, shape, or color. ? You have a mole that is larger than a pencil eraser.  Always use sunscreen. Apply sunscreen liberally and repeat throughout the day.  Protect yourself by wearing long sleeves, pants, a wide-brimmed hat, and sunglasses when outside.
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
## 2018-01-27 NOTE — Addendum Note (Signed)
Addended by: Edgar Frisk on: 01/27/2018 12:51 PM   Modules accepted: Orders

## 2018-01-28 ENCOUNTER — Other Ambulatory Visit: Payer: Self-pay | Admitting: Medical

## 2018-01-28 LAB — CBC
HEMATOCRIT: 46.5 % (ref 37.5–51.0)
HEMOGLOBIN: 15.9 g/dL (ref 13.0–17.7)
MCH: 30 pg (ref 26.6–33.0)
MCHC: 34.2 g/dL (ref 31.5–35.7)
MCV: 88 fL (ref 79–97)
Platelets: 236 10*3/uL (ref 150–450)
RBC: 5.3 x10E6/uL (ref 4.14–5.80)
RDW: 14.9 % (ref 12.3–15.4)
WBC: 12 10*3/uL — AB (ref 3.4–10.8)

## 2018-01-28 LAB — COMPREHENSIVE METABOLIC PANEL
A/G RATIO: 1.9 (ref 1.2–2.2)
ALT: 20 IU/L (ref 0–44)
AST: 15 IU/L (ref 0–40)
Albumin: 4.7 g/dL (ref 3.5–5.5)
Alkaline Phosphatase: 60 IU/L (ref 39–117)
BUN/Creatinine Ratio: 9 (ref 9–20)
BUN: 10 mg/dL (ref 6–24)
Bilirubin Total: 0.5 mg/dL (ref 0.0–1.2)
CO2: 23 mmol/L (ref 20–29)
Calcium: 10 mg/dL (ref 8.7–10.2)
Chloride: 102 mmol/L (ref 96–106)
Creatinine, Ser: 1.06 mg/dL (ref 0.76–1.27)
GFR calc non Af Amer: 85 mL/min/{1.73_m2} (ref >59)
GFR, EST AFRICAN AMERICAN: 98 mL/min/{1.73_m2} (ref >59)
GLUCOSE: 83 mg/dL (ref 65–99)
Globulin, Total: 2.5 g/dL (ref 1.5–4.5)
POTASSIUM: 4.7 mmol/L (ref 3.5–5.2)
Sodium: 139 mmol/L (ref 134–144)
TOTAL PROTEIN: 7.2 g/dL (ref 6.0–8.5)

## 2018-01-28 LAB — HEPATITIS B SURFACE ANTIBODY, QUANTITATIVE: Hepatitis B Surf Ab Quant: <3.1 m[IU]/mL — ABNORMAL LOW (ref >9.9)

## 2018-01-28 LAB — HIV ANTIBODY (ROUTINE TESTING W REFLEX): HIV SCREEN 4TH GENERATION: NONREACTIVE

## 2018-01-28 LAB — PSA: Prostate Specific Ag, Serum: 1.4 ng/mL (ref 0.0–4.0)

## 2018-01-28 LAB — LIPID PANEL
Chol/HDL Ratio: 6.1 ratio — ABNORMAL HIGH (ref 0.0–5.0)
Cholesterol, Total: 178 mg/dL (ref 100–199)
HDL: 29 mg/dL — AB (ref >39)
TRIGLYCERIDES: 419 mg/dL — AB (ref 0–149)

## 2018-01-28 LAB — HEMOGLOBIN A1C
Est. average glucose Bld gHb Est-mCnc: 108 mg/dL
Hgb A1c MFr Bld: 5.4 % (ref 4.8–5.6)

## 2018-01-28 LAB — MICROALBUMIN / CREATININE URINE RATIO
Creatinine, Urine: 259.6 mg/dL
MICROALB/CREAT RATIO: 9.7 mg/g{creat} (ref 0.0–30.0)
MICROALBUM., U, RANDOM: 25.3 ug/mL

## 2018-01-28 LAB — TSH: TSH: 1.23 u[IU]/mL (ref 0.450–4.500)

## 2018-01-28 LAB — RPR: RPR: NONREACTIVE

## 2018-01-28 MED ORDER — DOXAZOSIN MESYLATE 1 MG PO TABS: 1.0000 mg | ORAL_TABLET | Freq: Every day | ORAL | 2 refills | Status: DC

## 2018-01-29 LAB — GC/CHLAMYDIA PROBE AMP
CHLAMYDIA, DNA PROBE: NEGATIVE
NEISSERIA GONORRHOEAE BY PCR: NEGATIVE

## 2018-03-03 ENCOUNTER — Ambulatory Visit (INDEPENDENT_AMBULATORY_CARE_PROVIDER_SITE_OTHER): Payer: BLUE CROSS/BLUE SHIELD | Admitting: Medical

## 2018-03-03 VITALS — BP 140/90 | HR 84 | Temp 98.0°F | Resp 16 | Ht 73.75 in | Wt 281.6 lb

## 2018-03-03 DIAGNOSIS — E782 Mixed hyperlipidemia: Secondary | ICD-10-CM | POA: Insufficient documentation

## 2018-03-03 DIAGNOSIS — F17200 Nicotine dependence, unspecified, uncomplicated: Secondary | ICD-10-CM | POA: Diagnosis not present

## 2018-03-03 DIAGNOSIS — N401 Enlarged prostate with lower urinary tract symptoms: Secondary | ICD-10-CM

## 2018-03-03 DIAGNOSIS — I10 Essential (primary) hypertension: Secondary | ICD-10-CM

## 2018-03-03 MED ORDER — FINASTERIDE 1 MG PO TABS: 1.0000 mg | ORAL_TABLET | Freq: Every day | ORAL | 0 refills | Status: DC

## 2018-03-03 MED ORDER — DOXAZOSIN MESYLATE 4 MG PO TABS: ORAL_TABLET | ORAL | 2 refills | Status: DC

## 2018-03-03 NOTE — Progress Notes (Signed)
Subjective:  Andrew Escobar is a 42 y.o. male who presents for Chief Complaint  Patient presents with  . Follow-up    1 month f/u  medication not working     I saw him last month as a new patient for a physical.  At that time we started Cardura to help with both his BPH symptoms as well as blood pressure so far he sees no improvement in either issue.  No chest pain no difficulty breathing no swelling.  He has had prostate issues for several years, thinks he first went to urology for BPH about 2015  A he also notes being put on cholesterol medicine one time but did not want to take it and made him feel bad.   At one point was only medicine for prostate, but did not take it very long.  He did not want to take medications  No other aggravating or relieving factors.    No other c/o.  The following portions of the patient's history were reviewed and updated as appropriate: allergies, current medications, past family history, past medical history, past social history, past surgical history and problem list.  ROS Otherwise as in subjective above    Objective: BP 140/90   Pulse 84   Temp 98 F (36.7 C) (Oral)   Resp 16   Ht 6' 1.75" (1.873 m)   Wt 281 lb 9.6 oz (127.7 kg)   SpO2 96%   BMI 36.40 kg/m   General appearance: alert, no distress, well developed, well nourished Neck: supple, no lymphadenopathy, no thyromegaly, no masses Heart: RRR, normal S1, S2, no murmurs Lungs: CTA bilaterally, no wheezes, rhonchi, or rales Pulses: 2+ radial pulses, 2+ pedal pulses, normal cap refill Ext: no edema   Assessment: Encounter Diagnoses  Name Primary?  . Essential hypertension, benign Yes  . Benign prostatic hyperplasia with lower urinary tract symptoms, symptom details unspecified   . Smoker   . Mixed dyslipidemia      Plan: Hypertension- increase Cardura to 2 mg daily.  I wrote for 4 mg tablets he will cut in half in case we need to increase dose again.   BPH-increase Cardura  to 2 mg daily, begin finasteride with plan to go up on that dose as well once he is tolerating it just fine  Counseled on diet, exercise, need for weight loss and diet changes, particular given his blood pressure and elevated lipids  Advised smoking cessation  I expressed my sympathy for the loss of his cousin who was murdered yesterday  I recommend he call back in 2 weeks to give me an update on medication tolerance and blood pressure readings  Emilliano was seen today for follow-up.  Diagnoses and all orders for this visit:  Essential hypertension, benign  Benign prostatic hyperplasia with lower urinary tract symptoms, symptom details unspecified  Smoker  Mixed dyslipidemia  Other orders -     doxazosin (CARDURA) 4 MG tablet; Take 1/2 tablet daily -     finasteride (PROPECIA) 1 MG tablet; Take 1 tablet (1 mg total) by mouth daily.    Follow up: 2 wk call back

## 2018-03-16 ENCOUNTER — Telehealth: Payer: Self-pay

## 2018-03-16 NOTE — Telephone Encounter (Signed)
Pt has called to inform us of his bp readings  03/11/18- 137/96  03/12/18- 142/100  03/15/18- 155/100  Pt takes BP medication every morning when he wakes up.

## 2018-03-16 NOTE — Telephone Encounter (Signed)
Have him increase to 1 tablet or 43m Cardura daily.  Call report again in 2 wk

## 2018-03-17 NOTE — Telephone Encounter (Signed)
Pt has been informed of providers note.

## 2018-04-13 ENCOUNTER — Telehealth: Payer: Self-pay

## 2018-04-13 NOTE — Telephone Encounter (Signed)
Pt called to leave his blood pressure reading:    04/13/18- 145/105  04/12/18- 152/110  04/11/18- 147/98

## 2018-04-14 ENCOUNTER — Other Ambulatory Visit: Payer: Self-pay | Admitting: Medical

## 2018-04-14 MED ORDER — AMLODIPINE BESYLATE 10 MG PO TABS: 10.0000 mg | ORAL_TABLET | Freq: Every day | ORAL | 2 refills | Status: DC

## 2018-04-14 NOTE — Telephone Encounter (Signed)
I reviewed the readings.  Continue current medications, but lets add Amlodipine once daily in the morning to help lower pressure as his current regimen is just not strong enough.  F/u in 1 month.

## 2018-04-15 NOTE — Telephone Encounter (Signed)
lmom of providers note and asked patient to call the office if he has any questions about the change.

## 2018-04-18 NOTE — Telephone Encounter (Signed)
Pt called on last Friday and was advised again of provider note and change with medication.

## 2018-05-14 ENCOUNTER — Emergency Department (HOSPITAL_COMMUNITY)
Admission: EM | Admit: 2018-05-14 | Discharge: 2018-05-14 | Disposition: A | Discharge status: Home / Self Care | Payer: BLUE CROSS/BLUE SHIELD | Attending: Emergency Medicine | Admitting: Emergency Medicine

## 2018-05-14 ENCOUNTER — Encounter (HOSPITAL_COMMUNITY): Payer: Self-pay | Admitting: *Deleted

## 2018-05-14 ENCOUNTER — Other Ambulatory Visit: Payer: Self-pay

## 2018-05-14 ENCOUNTER — Emergency Department (HOSPITAL_COMMUNITY): Payer: BLUE CROSS/BLUE SHIELD

## 2018-05-14 DIAGNOSIS — S99922A Unspecified injury of left foot, initial encounter: Secondary | ICD-10-CM | POA: Diagnosis not present

## 2018-05-14 DIAGNOSIS — Y990 Civilian activity done for income or pay: Secondary | ICD-10-CM | POA: Diagnosis not present

## 2018-05-14 DIAGNOSIS — Z79899 Other long term (current) drug therapy: Secondary | ICD-10-CM | POA: Diagnosis not present

## 2018-05-14 DIAGNOSIS — W450XXA Nail entering through skin, initial encounter: Secondary | ICD-10-CM | POA: Diagnosis not present

## 2018-05-14 DIAGNOSIS — Z7721 Contact with and (suspected) exposure to potentially hazardous body fluids: Secondary | ICD-10-CM | POA: Insufficient documentation

## 2018-05-14 DIAGNOSIS — F17290 Nicotine dependence, other tobacco product, uncomplicated: Secondary | ICD-10-CM | POA: Insufficient documentation

## 2018-05-14 DIAGNOSIS — Y9389 Activity, other specified: Secondary | ICD-10-CM | POA: Insufficient documentation

## 2018-05-14 DIAGNOSIS — S91332A Puncture wound without foreign body, left foot, initial encounter: Secondary | ICD-10-CM | POA: Diagnosis not present

## 2018-05-14 DIAGNOSIS — Y9289 Other specified places as the place of occurrence of the external cause: Secondary | ICD-10-CM | POA: Insufficient documentation

## 2018-05-14 DIAGNOSIS — I10 Essential (primary) hypertension: Secondary | ICD-10-CM | POA: Diagnosis not present

## 2018-05-14 DIAGNOSIS — F419 Anxiety disorder, unspecified: Secondary | ICD-10-CM | POA: Diagnosis not present

## 2018-05-14 LAB — RAPID HIV SCREEN (HIV 1/2 AB+AG)
HIV 1/2 Antibodies: NONREACTIVE
HIV-1 P24 Antigen - HIV24: NONREACTIVE

## 2018-05-14 MED ORDER — DOXYCYCLINE HYCLATE 100 MG PO TABS
100.0000 mg | ORAL_TABLET | Freq: Once | ORAL | Status: AC
  Administered 2018-05-14: 100 mg via ORAL
  Filled 2018-05-14: qty 1

## 2018-05-14 MED ORDER — DOXYCYCLINE HYCLATE 100 MG PO CAPS: 100.0000 mg | ORAL_CAPSULE | Freq: Two times a day (BID) | ORAL | 0 refills | Status: AC

## 2018-05-14 NOTE — ED Notes (Signed)
See EDP assessment 

## 2018-05-14 NOTE — ED Notes (Signed)
Patient transported to X-ray 
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
## 2018-05-14 NOTE — Discharge Instructions (Signed)
Please take all of your antibiotics until finished!   You may develop abdominal discomfort or diarrhea from the antibiotic.  You may help offset this with probiotics which you can buy or get in yogurt. Do not eat  or take the probiotics until 2 hours after your antibiotic.   Can take ibuprofen or Tylenol as needed for pain.  Follow-up with your primary care physician on Monday as scheduled for reevaluation of your blood work and for discussion of your work-up today.  Return to the emergency department immediately for any concerning signs or symptoms develop such as fevers, redness, swelling, loss of pulses, or abnormal drainage.

## 2018-05-14 NOTE — ED Notes (Signed)
Pt verbalizes understanding of d/c instructions. Prescriptions reviewed with patient. Pt ambulatory at d/c with all belongings.

## 2018-05-14 NOTE — ED Triage Notes (Signed)
The pt was working in [employer] around 2 decomposed bodies and a nail  Penetrated the bottom of his lt shoe and into the bottom of his foot.  He just wants to have  His foot checked out  Test in the past 5 years he thinks

## 2018-05-16 ENCOUNTER — Encounter: Payer: Self-pay | Admitting: Medical

## 2018-05-16 ENCOUNTER — Ambulatory Visit (INDEPENDENT_AMBULATORY_CARE_PROVIDER_SITE_OTHER): Payer: BLUE CROSS/BLUE SHIELD | Admitting: Medical

## 2018-05-16 VITALS — BP 126/80 | HR 80 | Temp 98.1°F | Resp 16 | Ht 73.75 in | Wt 282.6 lb

## 2018-05-16 DIAGNOSIS — N401 Enlarged prostate with lower urinary tract symptoms: Secondary | ICD-10-CM

## 2018-05-16 DIAGNOSIS — I10 Essential (primary) hypertension: Secondary | ICD-10-CM | POA: Diagnosis not present

## 2018-05-16 DIAGNOSIS — N529 Male erectile dysfunction, unspecified: Secondary | ICD-10-CM

## 2018-05-16 DIAGNOSIS — E782 Mixed hyperlipidemia: Secondary | ICD-10-CM

## 2018-05-16 DIAGNOSIS — S91332A Puncture wound without foreign body, left foot, initial encounter: Secondary | ICD-10-CM

## 2018-05-16 LAB — HEPATITIS PANEL, ACUTE
HCV Ab: <0.1 s/co ratio (ref 0.0–0.9)
Hep A IgM: NEGATIVE
Hep B C IgM: NEGATIVE
Hepatitis B Surface Ag: NEGATIVE

## 2018-05-16 MED ORDER — SILDENAFIL CITRATE 100 MG PO TABS: 50.0000 mg | ORAL_TABLET | Freq: Every day | ORAL | 2 refills | Status: DC | PRN

## 2018-05-16 MED ORDER — DOXAZOSIN MESYLATE 4 MG PO TABS: 4.0000 mg | ORAL_TABLET | Freq: Every day | ORAL | 11 refills | Status: DC

## 2018-05-16 MED ORDER — TAMSULOSIN HCL 0.4 MG PO CAPS: 0.4000 mg | ORAL_CAPSULE | Freq: Every day | ORAL | 11 refills | Status: AC
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
## 2018-06-29 ENCOUNTER — Other Ambulatory Visit: Payer: Self-pay

## 2018-06-29 ENCOUNTER — Ambulatory Visit (HOSPITAL_COMMUNITY)
Admission: EM | Admit: 2018-06-29 | Discharge: 2018-06-29 | Disposition: A | Discharge status: Home / Self Care | Payer: BLUE CROSS/BLUE SHIELD | Attending: Family Medicine | Admitting: Family Medicine

## 2018-06-29 ENCOUNTER — Encounter (HOSPITAL_COMMUNITY): Payer: Self-pay | Admitting: Emergency Medicine

## 2018-06-29 DIAGNOSIS — Z880 Allergy status to penicillin: Secondary | ICD-10-CM | POA: Diagnosis not present

## 2018-06-29 DIAGNOSIS — Z833 Family history of diabetes mellitus: Secondary | ICD-10-CM | POA: Insufficient documentation

## 2018-06-29 DIAGNOSIS — Z8249 Family history of ischemic heart disease and other diseases of the circulatory system: Secondary | ICD-10-CM | POA: Diagnosis not present

## 2018-06-29 DIAGNOSIS — I10 Essential (primary) hypertension: Secondary | ICD-10-CM | POA: Insufficient documentation

## 2018-06-29 DIAGNOSIS — Z79899 Other long term (current) drug therapy: Secondary | ICD-10-CM | POA: Insufficient documentation

## 2018-06-29 DIAGNOSIS — F17290 Nicotine dependence, other tobacco product, uncomplicated: Secondary | ICD-10-CM | POA: Insufficient documentation

## 2018-06-29 DIAGNOSIS — N485 Ulcer of penis: Secondary | ICD-10-CM | POA: Diagnosis not present

## 2018-06-29 DIAGNOSIS — R21 Rash and other nonspecific skin eruption: Secondary | ICD-10-CM | POA: Diagnosis not present

## 2018-06-29 LAB — RAPID HIV SCREEN (HIV 1/2 AB+AG)
HIV 1/2 Antibodies: NONREACTIVE
HIV-1 P24 Antigen - HIV24: NONREACTIVE

## 2018-06-29 MED ORDER — FLUCONAZOLE 150 MG PO TABS: 150.0000 mg | ORAL_TABLET | Freq: Once | ORAL | 0 refills | Status: AC

## 2018-06-29 MED ORDER — AZITHROMYCIN 250 MG PO TABS
ORAL_TABLET | ORAL | Status: AC
  Filled 2018-06-29: qty 4

## 2018-06-29 MED ORDER — AZITHROMYCIN 250 MG PO TABS
1000.0000 mg | ORAL_TABLET | Freq: Once | ORAL | Status: AC
  Administered 2018-06-29: 1000 mg via ORAL

## 2018-06-29 NOTE — Discharge Instructions (Addendum)
We are running a series of STD tests.  You can check online to see what the results are in the next 2 days.  We will call you for any positive tests.
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
## 2018-06-29 NOTE — ED Triage Notes (Signed)
Pt reports scabs that come and go on his penis x2 weeks.  He denies any penile burning or discharge.

## 2018-06-30 LAB — RPR: RPR Ser Ql: NONREACTIVE

## 2018-06-30 LAB — HSV 2 ANTIBODY, IGG: HSV 2 Glycoprotein G Ab, IgG: <0.91 index (ref 0.00–0.90)

## 2018-07-01 ENCOUNTER — Telehealth: Payer: Self-pay | Admitting: Emergency Medicine

## 2018-07-01 LAB — URINE CYTOLOGY ANCILLARY ONLY
Chlamydia: NEGATIVE
Neisseria Gonorrhea: NEGATIVE
Trichomonas: POSITIVE — AB

## 2018-07-01 MED ORDER — ONDANSETRON HCL 8 MG PO TABS: 8.0000 mg | ORAL_TABLET | Freq: Once | ORAL | 0 refills | Status: AC

## 2018-07-01 MED ORDER — METRONIDAZOLE 500 MG PO TABS: ORAL_TABLET | ORAL | 0 refills | Status: DC

## 2018-07-01 NOTE — Telephone Encounter (Signed)
Called patient, Demographics verified, Trichomonas is positive.  Rx  for Flagyl 2 grams, once was sent to the pharmacy of record. Pt was advised to refrain from sexual intercourse for 7 days to give the medicine time to work.  Sexual partners need to be notified and tested/treated.  Condoms may reduce risk of reinfection.  Recheck for further evaluation if symptoms are not improving. Per Dr. Manson Allan for 8 mg Zofan also sent in  To pharmacy of record for nausea.  Patient verbalized uunderstanding.

## 2018-07-10 ENCOUNTER — Other Ambulatory Visit: Payer: Self-pay | Admitting: Medical

## 2018-07-13 ENCOUNTER — Ambulatory Visit (HOSPITAL_COMMUNITY)
Admission: EM | Admit: 2018-07-13 | Discharge: 2018-07-13 | Disposition: A | Discharge status: Home / Self Care | Payer: BLUE CROSS/BLUE SHIELD | Attending: Family Medicine | Admitting: Family Medicine

## 2018-07-13 ENCOUNTER — Encounter (HOSPITAL_COMMUNITY): Payer: Self-pay

## 2018-07-13 DIAGNOSIS — N481 Balanitis: Secondary | ICD-10-CM | POA: Diagnosis not present

## 2018-07-13 MED ORDER — CLOTRIMAZOLE 1 % EX CREA: TOPICAL_CREAM | CUTANEOUS | 0 refills | Status: DC

## 2018-07-13 NOTE — Discharge Instructions (Addendum)
Moisturize daily Clotrimazole cream prescribed.  Used as directed Return or follow up with PCP in 1 week if symptoms persists Return or go to the ER if you have any new or worsening symptoms

## 2018-07-13 NOTE — ED Triage Notes (Signed)
Pt presents for follow up after antifungal he was given did not work.
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
## 2018-08-25 ENCOUNTER — Ambulatory Visit (INDEPENDENT_AMBULATORY_CARE_PROVIDER_SITE_OTHER): Payer: BLUE CROSS/BLUE SHIELD | Admitting: Medical

## 2018-08-25 ENCOUNTER — Encounter: Payer: Self-pay | Admitting: Medical

## 2018-08-25 VITALS — BP 138/84 | HR 77 | Temp 98.1°F | Resp 16 | Ht 74.0 in | Wt 290.6 lb

## 2018-08-25 DIAGNOSIS — E669 Obesity, unspecified: Secondary | ICD-10-CM

## 2018-08-25 DIAGNOSIS — Z8739 Personal history of other diseases of the musculoskeletal system and connective tissue: Secondary | ICD-10-CM

## 2018-08-25 DIAGNOSIS — I10 Essential (primary) hypertension: Secondary | ICD-10-CM

## 2018-08-25 DIAGNOSIS — A5903 Trichomonal cystitis and urethritis: Secondary | ICD-10-CM | POA: Insufficient documentation

## 2018-08-25 DIAGNOSIS — E785 Hyperlipidemia, unspecified: Secondary | ICD-10-CM | POA: Diagnosis not present

## 2018-08-25 DIAGNOSIS — E782 Mixed hyperlipidemia: Secondary | ICD-10-CM

## 2018-08-25 DIAGNOSIS — N401 Enlarged prostate with lower urinary tract symptoms: Secondary | ICD-10-CM

## 2018-08-25 DIAGNOSIS — N481 Balanitis: Secondary | ICD-10-CM | POA: Diagnosis not present

## 2018-08-25 DIAGNOSIS — F17200 Nicotine dependence, unspecified, uncomplicated: Secondary | ICD-10-CM

## 2018-08-25 DIAGNOSIS — N529 Male erectile dysfunction, unspecified: Secondary | ICD-10-CM

## 2018-08-25 LAB — POCT WET PREP (WET MOUNT): Trichomonas Wet Prep HPF POC: ABSENT

## 2018-08-25 MED ORDER — FLUCONAZOLE 150 MG PO TABS: ORAL_TABLET | ORAL | 0 refills | Status: AC

## 2018-08-25 MED ORDER — TADALAFIL 5 MG PO TABS: 5.0000 mg | ORAL_TABLET | Freq: Every day | ORAL | 11 refills | Status: AC

## 2018-08-25 MED ORDER — AMLODIPINE BESYLATE 10 MG PO TABS: 10.0000 mg | ORAL_TABLET | Freq: Every day | ORAL | 3 refills | Status: AC

## 2018-08-25 MED ORDER — DOXAZOSIN MESYLATE 4 MG PO TABS: 4.0000 mg | ORAL_TABLET | Freq: Every day | ORAL | 3 refills | Status: AC

## 2018-08-25 NOTE — Patient Instructions (Signed)
Hypertension/high blood pressure  Your blood pressure looks pretty good today  Continue amlodipine 10 mg daily, continue Cardura 4 mg daily  Continue to monitor your blood pressures regularly.  Goal is 120/80  Erectile dysfunction  Since your insurance will not cover name brand Viagra we will need to switch to something else  Begin Cialis 71m daily for daily use for prostate and ED  Balanitis/irritation of the head of the penis  Begin fluconazole tablet 150 mg 1 tablet weekly for the next 3 weeks  You can either use the A&E ointment or the fungal cream that was initially prescribed  If not much improved within 2 weeks let me know  High cholesterol, high triglycerides  Your high cholesterol puts you at high risk for heart disease.  You should be on a cholesterol medicine to lower your risk of heart disease however you did have a history of rhabdomyolysis or muscle inflammation back in 2014  Lets postpone this for now.  Enlarged prostate with symptoms  You are currently taking Cardura and Flomax to help with this.  I recommend we add Cialis for daily use  If you do not see improvements over the next month, then it will be time to have you go and see urology  Tobacco use  I recommend you stop tobacco use completely  I recommend you call 1 800 quit now hotline to help find ways to quit tobacco  Obesity-I recommend you continue efforts with healthy diet and exercise to lose weight which will help your blood pressure and cholesterol      Lets change strategies and try something that may work better than what you are currently doing   Breakfast You may eat 1 of the following  Omelette, which can include a small amount of cheese, and vegetables such as peppers, mushrooms, small pieces of [place] chicken  Low sugar yogurt serving which can include some fruit such as berries  Egg whites or hard boiled egg and meat (1-2 strips of bacon, or small piece of [place] sausage or t[place]bacon)   Mid-morning snack 1 fruit serving such as one of the following:  medium-sized apple  medium-sized orange,  Tangerine  1/2 banana   3/4 cup of fresh berries or frozen berries  A protein source such as one of the following:  8 almonds   small handful of walnuts or other nuts  small piece of cheese,  low sugar yogurt   Lunch A protein source such as 1 of the following: . 1 serving of beans such as black beans, pinto beans, green beans, or edamame (soy beans) . 1 meat serving such as 6 oz or deck of card size serving of fish, skinless chicken, or [place] either grilled or baked preferably.   You can use some pork or beef, but limit this compared to fish, chicken or t[place]Vegetable - Half of your plate should be a non-starchy vegetables!  So avoid white potatoes and corn.  Otherwise, eat a large portion of vegetables. . Avocado, cucumber, tomato, carrots, greens, lettuce, squash, okra, etc.  . Vegetables can include salad with olive oil/vinaigrette dressing   Mid-afternoon snack 1 fruit serving such as one of the following:  medium-sized apple  medium-sized orange,  Tangerine  1/2 banana   3/4 cup of fresh berries or frozen berries  A protein source such as one of the following:  8 almonds   small handful of walnuts or other nuts  small piece of cheese,  low sugar yogurt   Dinner A protein source such as 1 of the following: . 1 serving of beans such as black beans, pinto beans, green beans, or edamame (soy beans) . 1 meat serving such as 6 oz or deck of card size serving of fish, skinless chicken, or [place], either grilled or baked preferably.   You can use some pork or beef, but limit this compared to fish, chicken or [place] Vegetable - Half of your plate should be a non-starchy vegetables!  So avoid white potatoes and corn.  Otherwise, eat a large portion of vegetables. . Avocado, cucumber, tomato, carrots, greens, lettuce,  squash, okra, etc.  . Vegetables can include salad with olive oil/vinaigrette dressing   Beverages: Water Unsweet tea Home made juice with a juicer without sugar added other than small bit of honey or agave nectar Water with sugar free flavor such as [place]   AVOID.... For the time being I want you to cut out the following items completely: . Soda, sweet tea, juice, beer or wine or alcohol . ALL grains and breads including rice, pasta, bread, cereal . Sweets such as cake, candy, pies, chips, cookies, chocolate
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
## 2018-09-18 ENCOUNTER — Telehealth: Payer: Self-pay | Admitting: Medical

## 2018-09-18 NOTE — Telephone Encounter (Signed)
P.A. CIALIS

## 2018-09-20 NOTE — Telephone Encounter (Signed)
P.A. denied, due to pt being on alpha blocker, tamsulosin, states doctor must tell us that pt will stop the alpha blocker before starting the requested drug? Please advise

## 2018-09-21 NOTE — Telephone Encounter (Signed)
We received notice that insurance may be willing to approve Cialis for daily use for both prostate enlargement and erectile dysfunction.  However we will have to stop both Cardura and Flomax due to safety concerns if we are going to start Cialis.  Thus would like to make the following changes slowly as you cannot stop those 2 medicines abruptly #1- use Flomax every other day this week then stop it #2- take 1/2 tablet of the Cardura daily for 1 week, then take 1/2 tablet of Cardura every other day the following week and then stop this #3- after he is completely off of these medications that he can begin Cialis 5 mg daily use #4-continue amlodipine for blood pressure, but let us also add a different blood pressure pill called losartan once daily since we are stopping the Cardura  Thus, 3 weeks now he would be taking amlodipine for blood pressure, losartan for blood pressure, and would be off of Cardura and Flomax.  He would be taking Cialis for daily use for large prostate as well as for erectile dysfunction  See if agreeable to these changes and mail him a copy of these instructions if agreeable

## 2018-09-26 ENCOUNTER — Telehealth: Payer: Self-pay | Admitting: Medical

## 2018-09-26 NOTE — Telephone Encounter (Signed)
Dismissal letter in guarantor snapshot

## 2018-09-26 NOTE — Telephone Encounter (Signed)
Left message on voicemail for patient to call back. 

## 2018-09-28 NOTE — Telephone Encounter (Signed)
This patient has been dismissed from the practice, and he has not called me back. Please advise?

## 2018-09-28 NOTE — Telephone Encounter (Signed)
Try one more time

## 2018-09-30 NOTE — Telephone Encounter (Signed)
Left message on voicemail for patient to call back. 

## 2018-10-03 NOTE — Telephone Encounter (Signed)
Patient did not call back.

## 2018-11-11 ENCOUNTER — Emergency Department (HOSPITAL_COMMUNITY): Payer: BLUE CROSS/BLUE SHIELD

## 2018-11-11 ENCOUNTER — Emergency Department (HOSPITAL_COMMUNITY)
Admission: EM | Admit: 2018-11-11 | Discharge: 2018-11-11 | Discharge status: Against Medical Advice | Payer: BLUE CROSS/BLUE SHIELD | Attending: Emergency Medicine | Admitting: Emergency Medicine

## 2018-11-11 ENCOUNTER — Encounter (HOSPITAL_COMMUNITY): Payer: Self-pay | Admitting: Emergency Medicine

## 2018-11-11 DIAGNOSIS — Z5321 Procedure and treatment not carried out due to patient leaving prior to being seen by health care provider: Secondary | ICD-10-CM | POA: Insufficient documentation

## 2018-11-11 DIAGNOSIS — R079 Chest pain, unspecified: Secondary | ICD-10-CM | POA: Diagnosis not present

## 2018-11-11 DIAGNOSIS — R0789 Other chest pain: Secondary | ICD-10-CM | POA: Diagnosis not present

## 2018-11-11 LAB — CBC
HCT: 43.2 % (ref 39.0–52.0)
Hemoglobin: 14.6 g/dL (ref 13.0–17.0)
MCH: 30 pg (ref 26.0–34.0)
MCHC: 33.8 g/dL (ref 30.0–36.0)
MCV: 88.9 fL (ref 80.0–100.0)
NRBC: 0 % (ref 0.0–0.2)
Platelets: 236 10*3/uL (ref 150–400)
RBC: 4.86 MIL/uL (ref 4.22–5.81)
RDW: 14.4 % (ref 11.5–15.5)
WBC: 13.4 10*3/uL — ABNORMAL HIGH (ref 4.0–10.5)

## 2018-11-11 LAB — BASIC METABOLIC PANEL
Anion gap: 10 (ref 5–15)
BUN: 10 mg/dL (ref 6–20)
CO2: 23 mmol/L (ref 22–32)
Calcium: 9.2 mg/dL (ref 8.9–10.3)
Chloride: 106 mmol/L (ref 98–111)
Creatinine, Ser: 1.12 mg/dL (ref 0.61–1.24)
GFR calc Af Amer: >60 mL/min (ref >60)
GFR calc non Af Amer: >60 mL/min (ref >60)
Glucose, Bld: 80 mg/dL (ref 70–99)
Potassium: 4.2 mmol/L (ref 3.5–5.1)
SODIUM: 139 mmol/L (ref 135–145)

## 2018-11-11 LAB — I-STAT TROPONIN, ED: Troponin i, poc: 0.01 ng/mL (ref 0.00–0.08)

## 2018-11-11 MED ORDER — SODIUM CHLORIDE 0.9% FLUSH: 3.0000 mL | Freq: Once | INTRAVENOUS | Status: DC

## 2018-11-11 NOTE — ED Triage Notes (Signed)
Reports waking up at 0500 with cp.  Reports it eased up during the day but got worse when he woke up from a nap.

## 2018-11-11 NOTE — ED Notes (Signed)
Pt requested to leave. Could not get to stay.

## 2018-11-29 DIAGNOSIS — I10 Essential (primary) hypertension: Secondary | ICD-10-CM | POA: Diagnosis not present

## 2018-11-29 DIAGNOSIS — N401 Enlarged prostate with lower urinary tract symptoms: Secondary | ICD-10-CM | POA: Diagnosis not present

## 2018-12-20 ENCOUNTER — Encounter (HOSPITAL_COMMUNITY): Payer: Self-pay | Admitting: Emergency Medicine

## 2018-12-20 ENCOUNTER — Ambulatory Visit (HOSPITAL_COMMUNITY)
Admission: EM | Admit: 2018-12-20 | Discharge: 2018-12-20 | Disposition: A | Discharge status: Home / Self Care | Payer: BLUE CROSS/BLUE SHIELD | Attending: Family Medicine | Admitting: Family Medicine

## 2018-12-20 ENCOUNTER — Ambulatory Visit (INDEPENDENT_AMBULATORY_CARE_PROVIDER_SITE_OTHER): Payer: BLUE CROSS/BLUE SHIELD

## 2018-12-20 DIAGNOSIS — S6992XA Unspecified injury of left wrist, hand and finger(s), initial encounter: Secondary | ICD-10-CM

## 2018-12-20 DIAGNOSIS — S63502A Unspecified sprain of left wrist, initial encounter: Secondary | ICD-10-CM

## 2018-12-20 DIAGNOSIS — M25532 Pain in left wrist: Secondary | ICD-10-CM | POA: Diagnosis not present

## 2018-12-20 MED ORDER — TRAMADOL HCL 50 MG PO TABS: 50.0000 mg | ORAL_TABLET | Freq: Four times a day (QID) | ORAL | 0 refills | Status: AC | PRN

## 2018-12-20 MED ORDER — IBUPROFEN 600 MG PO TABS: 600.0000 mg | ORAL_TABLET | Freq: Four times a day (QID) | ORAL | 0 refills | Status: AC | PRN
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
## 2018-12-20 NOTE — Discharge Instructions (Signed)
Use anti-inflammatories for pain/swelling. You may take up to 600 mg Ibuprofen every 8 hours with food. You may supplement Ibuprofen with Tylenol [phone] mg every 8 hours.   Ice and rest wrist when at home Wear wrist brace around-the-clock for the next 1 to 2 weeks If you do not have any improvement in your symptoms please follow-up with orthopedics, contact information below Follow-up here in emergency room if developing increased pain, swelling, difficulty moving fingers

## 2018-12-20 NOTE — ED Triage Notes (Signed)
Pt states he hurt his left wrist at work yesterday, poor historian, unsure how he hurt his wrist.

## 2019-02-03 DIAGNOSIS — M25532 Pain in left wrist: Secondary | ICD-10-CM | POA: Diagnosis not present

## 2019-02-03 DIAGNOSIS — B359 Dermatophytosis, unspecified: Secondary | ICD-10-CM | POA: Diagnosis not present

## 2019-02-03 DIAGNOSIS — I10 Essential (primary) hypertension: Secondary | ICD-10-CM | POA: Diagnosis not present

## 2019-02-03 DIAGNOSIS — N401 Enlarged prostate with lower urinary tract symptoms: Secondary | ICD-10-CM | POA: Diagnosis not present

## 2019-02-03 DIAGNOSIS — M25531 Pain in right wrist: Secondary | ICD-10-CM | POA: Diagnosis not present

## 2019-02-07 DIAGNOSIS — R350 Frequency of micturition: Secondary | ICD-10-CM | POA: Diagnosis not present

## 2019-03-09 DIAGNOSIS — N401 Enlarged prostate with lower urinary tract symptoms: Secondary | ICD-10-CM | POA: Diagnosis not present

## 2019-03-09 DIAGNOSIS — R351 Nocturia: Secondary | ICD-10-CM | POA: Diagnosis not present

## 2019-03-09 DIAGNOSIS — R350 Frequency of micturition: Secondary | ICD-10-CM | POA: Diagnosis not present

## 2019-03-09 DIAGNOSIS — R3911 Hesitancy of micturition: Secondary | ICD-10-CM | POA: Diagnosis not present

## 2019-03-16 DIAGNOSIS — N401 Enlarged prostate with lower urinary tract symptoms: Secondary | ICD-10-CM | POA: Diagnosis not present

## 2019-03-16 DIAGNOSIS — I10 Essential (primary) hypertension: Secondary | ICD-10-CM | POA: Diagnosis not present

## 2019-03-16 DIAGNOSIS — B359 Dermatophytosis, unspecified: Secondary | ICD-10-CM | POA: Diagnosis not present

## 2019-03-29 DIAGNOSIS — M2241 Chondromalacia patellae, right knee: Secondary | ICD-10-CM | POA: Diagnosis not present

## 2019-03-29 DIAGNOSIS — R52 Pain, unspecified: Secondary | ICD-10-CM | POA: Diagnosis not present

## 2019-03-29 DIAGNOSIS — I10 Essential (primary) hypertension: Secondary | ICD-10-CM | POA: Diagnosis not present

## 2019-03-29 DIAGNOSIS — M2242 Chondromalacia patellae, left knee: Secondary | ICD-10-CM | POA: Diagnosis not present

## 2019-04-03 DIAGNOSIS — R351 Nocturia: Secondary | ICD-10-CM | POA: Diagnosis not present

## 2019-04-03 DIAGNOSIS — N401 Enlarged prostate with lower urinary tract symptoms: Secondary | ICD-10-CM | POA: Diagnosis not present

## 2019-04-07 DIAGNOSIS — R3914 Feeling of incomplete bladder emptying: Secondary | ICD-10-CM | POA: Diagnosis not present

## 2019-04-07 DIAGNOSIS — N401 Enlarged prostate with lower urinary tract symptoms: Secondary | ICD-10-CM | POA: Diagnosis not present

## 2019-05-25 DIAGNOSIS — R3912 Poor urinary stream: Secondary | ICD-10-CM | POA: Diagnosis not present

## 2019-05-25 DIAGNOSIS — R350 Frequency of micturition: Secondary | ICD-10-CM | POA: Diagnosis not present

## 2019-05-25 DIAGNOSIS — R351 Nocturia: Secondary | ICD-10-CM | POA: Diagnosis not present

## 2019-05-25 DIAGNOSIS — R8271 Bacteriuria: Secondary | ICD-10-CM | POA: Diagnosis not present

## 2019-05-25 DIAGNOSIS — N401 Enlarged prostate with lower urinary tract symptoms: Secondary | ICD-10-CM | POA: Diagnosis not present

## 2019-06-21 ENCOUNTER — Other Ambulatory Visit: Payer: Self-pay | Admitting: Ophthalmology

## 2019-06-21 DIAGNOSIS — N401 Enlarged prostate with lower urinary tract symptoms: Secondary | ICD-10-CM | POA: Diagnosis not present

## 2019-06-21 DIAGNOSIS — N400 Enlarged prostate without lower urinary tract symptoms: Secondary | ICD-10-CM | POA: Diagnosis not present

## 2019-08-02 DIAGNOSIS — K4090 Unilateral inguinal hernia, without obstruction or gangrene, not specified as recurrent: Secondary | ICD-10-CM | POA: Diagnosis not present

## 2019-08-02 DIAGNOSIS — Z23 Encounter for immunization: Secondary | ICD-10-CM | POA: Diagnosis not present

## 2019-08-02 DIAGNOSIS — N401 Enlarged prostate with lower urinary tract symptoms: Secondary | ICD-10-CM | POA: Diagnosis not present

## 2019-08-02 DIAGNOSIS — Z0000 Encounter for general adult medical examination without abnormal findings: Secondary | ICD-10-CM | POA: Diagnosis not present

## 2019-08-02 DIAGNOSIS — I10 Essential (primary) hypertension: Secondary | ICD-10-CM | POA: Diagnosis not present

## 2019-08-02 DIAGNOSIS — Z113 Encounter for screening for infections with a predominantly sexual mode of transmission: Secondary | ICD-10-CM | POA: Diagnosis not present

## 2019-08-10 DIAGNOSIS — R300 Dysuria: Secondary | ICD-10-CM | POA: Diagnosis not present

## 2019-08-15 DIAGNOSIS — R351 Nocturia: Secondary | ICD-10-CM | POA: Diagnosis not present

## 2019-08-15 DIAGNOSIS — N401 Enlarged prostate with lower urinary tract symptoms: Secondary | ICD-10-CM | POA: Diagnosis not present

## 2019-11-28 DIAGNOSIS — L218 Other seborrheic dermatitis: Secondary | ICD-10-CM | POA: Diagnosis not present

## 2019-11-28 DIAGNOSIS — L281 Prurigo nodularis: Secondary | ICD-10-CM | POA: Diagnosis not present

## 2019-11-30 ENCOUNTER — Ambulatory Visit: Payer: BLUE CROSS/BLUE SHIELD

## 2019-12-01 ENCOUNTER — Ambulatory Visit: Payer: BC Managed Care – PPO

## 2019-12-04 ENCOUNTER — Ambulatory Visit: Payer: BC Managed Care – PPO | Attending: Internal Medicine

## 2019-12-04 DIAGNOSIS — Z23 Encounter for immunization: Secondary | ICD-10-CM

## 2019-12-04 NOTE — Progress Notes (Signed)
   Covid-19 Vaccination Clinic  Name:  Andrew Escobar    MRN: [id number] DOB: 11/11/1975  12/04/2019  Andrew Escobar was observed post Covid-19 immunization for 15 minutes without incident. He was provided with Vaccine Information Sheet and instruction to access the V-Safe system.   Andrew Escobar was instructed to call 911 with any severe reactions post vaccine: Marland Kitchen Difficulty breathing  . Swelling of face and throat  . A fast heartbeat  . A bad rash all over body  . Dizziness and weakness   Immunizations Administered    Name Date Dose VIS Date Route   Pfizer COVID-19 Vaccine 12/04/2019 12:16 PM 0.3 mL 08/18/2019 Intramuscular   Manufacturer: [place]   Lot: [id number]   [place]: [id number]

## 2019-12-13 DIAGNOSIS — L218 Other seborrheic dermatitis: Secondary | ICD-10-CM | POA: Diagnosis not present

## 2019-12-13 DIAGNOSIS — L281 Prurigo nodularis: Secondary | ICD-10-CM | POA: Diagnosis not present

## 2019-12-27 ENCOUNTER — Ambulatory Visit: Payer: BC Managed Care – PPO | Attending: Internal Medicine

## 2019-12-27 DIAGNOSIS — Z23 Encounter for immunization: Secondary | ICD-10-CM

## 2019-12-27 NOTE — Progress Notes (Signed)
   Covid-19 Vaccination Clinic  Name:  Andrew Escobar    MRN: [id number] DOB: 1975/11/23  12/27/2019  [name] was observed post Covid-19 immunization for 30 minutes based on pre-vaccination screening without incident. He was provided with Vaccine Information Sheet and instruction to access the V-Safe system.   [name] was instructed to call 911 with any severe reactions post vaccine: Marland Kitchen Difficulty breathing  . Swelling of face and throat  . A fast heartbeat  . A bad rash all over body  . Dizziness and weakness   Immunizations Administered    Name Date Dose VIS Date Route   Pfizer COVID-19 Vaccine 12/27/2019  9:33 AM 0.3 mL 11/01/2018 Intramuscular   Manufacturer: [place]   Lot: [id number]   [place]: [id number]

## 2020-01-02 DIAGNOSIS — L218 Other seborrheic dermatitis: Secondary | ICD-10-CM | POA: Diagnosis not present

## 2020-01-23 ENCOUNTER — Other Ambulatory Visit: Payer: Self-pay | Admitting: Physician Assistant

## 2020-01-23 DIAGNOSIS — D485 Neoplasm of uncertain behavior of skin: Secondary | ICD-10-CM | POA: Diagnosis not present

## 2020-01-23 DIAGNOSIS — L408 Other psoriasis: Secondary | ICD-10-CM | POA: Diagnosis not present

## 2020-01-30 DIAGNOSIS — E782 Mixed hyperlipidemia: Secondary | ICD-10-CM | POA: Diagnosis not present

## 2020-01-30 DIAGNOSIS — I10 Essential (primary) hypertension: Secondary | ICD-10-CM | POA: Diagnosis not present

## 2020-02-06 DIAGNOSIS — L218 Other seborrheic dermatitis: Secondary | ICD-10-CM | POA: Diagnosis not present

## 2020-02-06 DIAGNOSIS — L819 Disorder of pigmentation, unspecified: Secondary | ICD-10-CM | POA: Diagnosis not present

## 2020-04-03 ENCOUNTER — Encounter (HOSPITAL_COMMUNITY): Payer: Self-pay | Admitting: Urgent Care

## 2020-04-03 ENCOUNTER — Ambulatory Visit (HOSPITAL_COMMUNITY)
Admission: EM | Admit: 2020-04-03 | Discharge: 2020-04-03 | Disposition: A | Discharge status: Home / Self Care | Payer: BC Managed Care – PPO | Attending: Urgent Care | Admitting: Urgent Care

## 2020-04-03 DIAGNOSIS — R0600 Dyspnea, unspecified: Secondary | ICD-10-CM

## 2020-04-03 DIAGNOSIS — F419 Anxiety disorder, unspecified: Secondary | ICD-10-CM

## 2020-04-03 DIAGNOSIS — I10 Essential (primary) hypertension: Secondary | ICD-10-CM

## 2020-04-03 DIAGNOSIS — R030 Elevated blood-pressure reading, without diagnosis of hypertension: Secondary | ICD-10-CM

## 2020-04-03 MED ORDER — HYDROXYZINE HCL 25 MG PO TABS: 12.5000 mg | ORAL_TABLET | Freq: Three times a day (TID) | ORAL | 0 refills | Status: AC | PRN

## 2020-04-03 NOTE — ED Triage Notes (Addendum)
Pt reports feeling restless throughout today, like he needs to jump up and go outside to get some air. Reports elevated blood pressure at home of 240/113. Reports taking medication at home for blood pressure. Pt reports feeling weird right now. Pt concerned for anxiety.
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
## 2020-04-03 NOTE — Discharge Instructions (Addendum)
Call Dr. Brett Fairy for consult on sleep apnea.    For diabetes or elevated blood sugar, please make sure you are avoiding starchy, carbohydrate foods like pasta, breads, pastry, rice, potatoes, desserts. These foods can elevated your blood sugar. Also, avoid sodas, sweet teas, sugary beverages, fruit juices.  Drinking plain water will be much more helpful, try 64 ounces of water daily.  It is okay to flavor your water naturally by cutting cucumber, lemon, mint or lime, placing it in a picture with water and drinking it over a period of 2 to 3 days as long as it remains refrigerated.    For elevated blood pressure, make sure you are monitoring salt in your diet.  Do not eat restaurant foods and limit processed foods at home, prepare/cook your own foods at home.  Processed foods include things like frozen meals preseasoned meats and dinners, deli meats, canned foods as they are high in sodium/salt.  Make sure your pain attention to sodium labels on foods you by at the grocery store.  For seasoning you can use a brand called Mrs. Dash which includes a lot of salt free seasonings.  Salads - kale, spinach, cabbage, spring mix; use seeds like pumpkin seeds or sunflower seeds, almonds, walnuts or pecans; you can also use 1-2 hard boiled eggs in your salads Fruits - avocadoes, berries (blueberries, raspberries, blackberries), apples, oranges, pomegranate, pear; avoid eating bananas, grapes regularly Vegetables - aspargus, cauliflower, broccoli, green beans, brussel spouts, bell peppers; stay away from starchy vegetables like potatoes, carrots, peas  Regarding meat it is better to eat lean meats and limit your red meat including pork to once a week.  Wild caught fish, chicken breast are good options as they tend to be leaner sources of good protein.   DO NOT EAT ANY FOODS ON THIS LIST THAT YOU ARE ALLERGIC TO.

## 2020-05-27 ENCOUNTER — Other Ambulatory Visit: Payer: Self-pay | Admitting: Nurse Practitioner

## 2020-05-27 ENCOUNTER — Ambulatory Visit
Admission: RE | Admit: 2020-05-27 | Discharge: 2020-05-27 | Disposition: A | Discharge status: Home / Self Care | Payer: No Typology Code available for payment source | Source: Ambulatory Visit | Attending: Nurse Practitioner | Admitting: Nurse Practitioner

## 2020-05-27 DIAGNOSIS — R609 Edema, unspecified: Secondary | ICD-10-CM

## 2020-05-27 DIAGNOSIS — M25522 Pain in left elbow: Secondary | ICD-10-CM

## 2021-05-20 IMAGING — CR DG ELBOW COMPLETE 3+V*L*
4 series · 4 of 4 positions shown · non-contrast
Comparison: None.

CLINICAL DATA: Pain following recent fall

EXAM:
LEFT ELBOW - COMPLETE 3+ VIEW

[x elbow ap left]
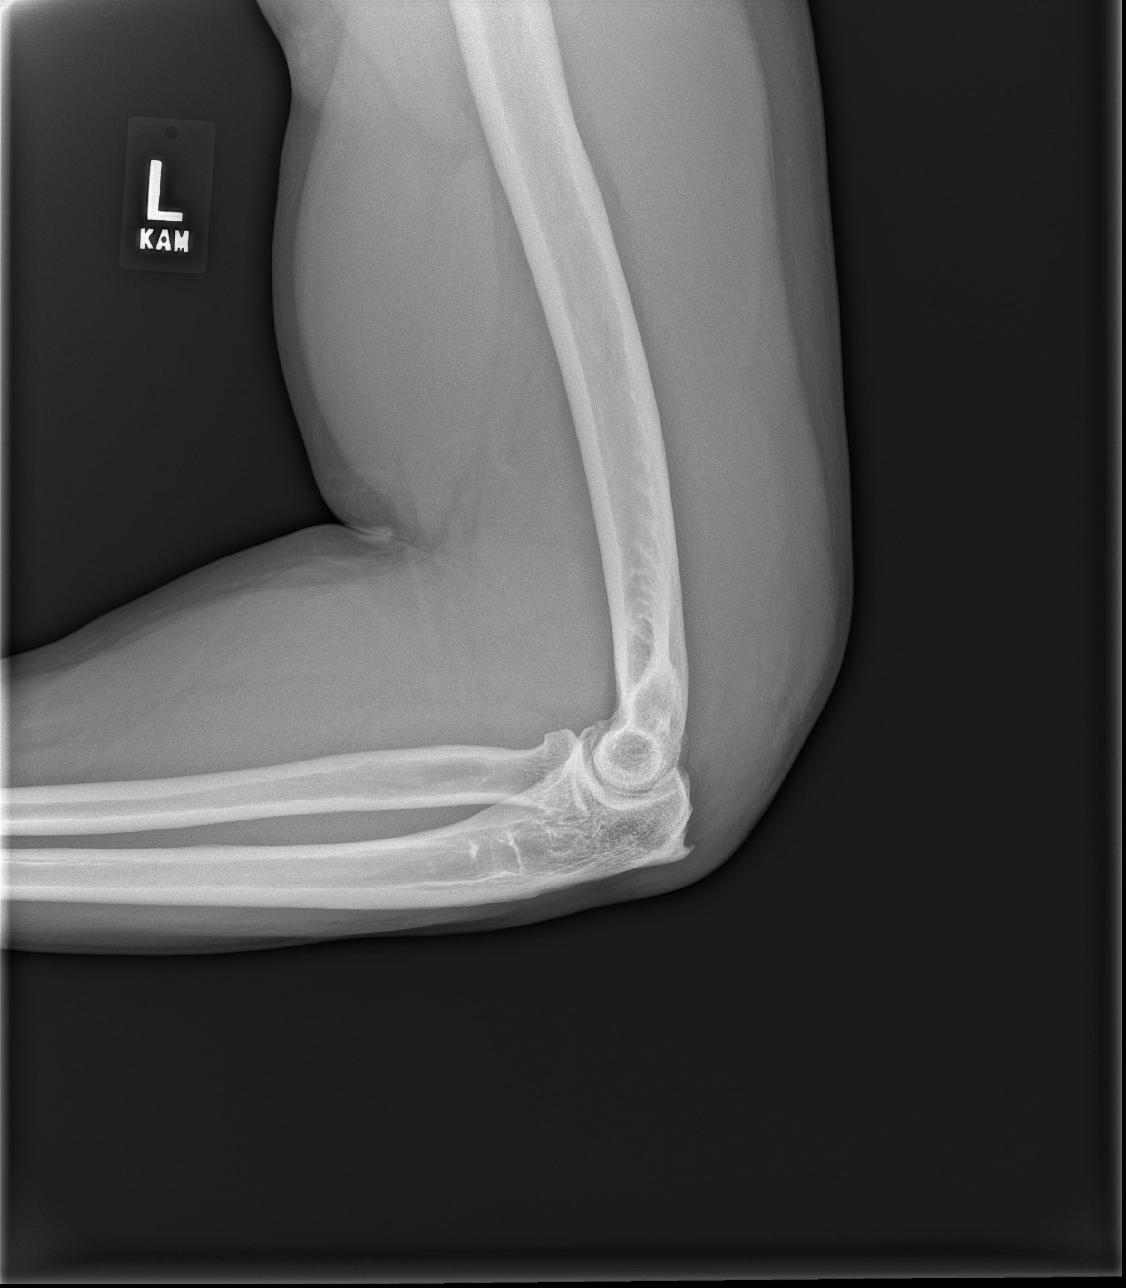

[x elbow obl left (1 of 2)]
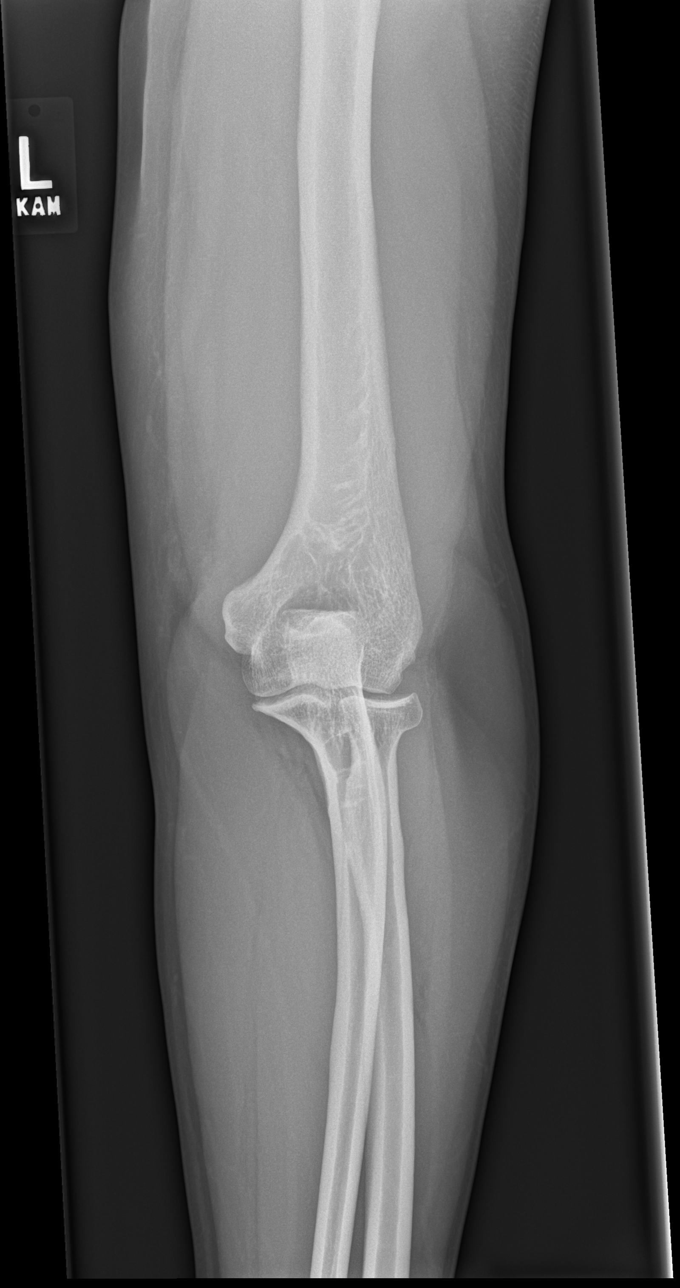

[x elbow obl left (2 of 2)]
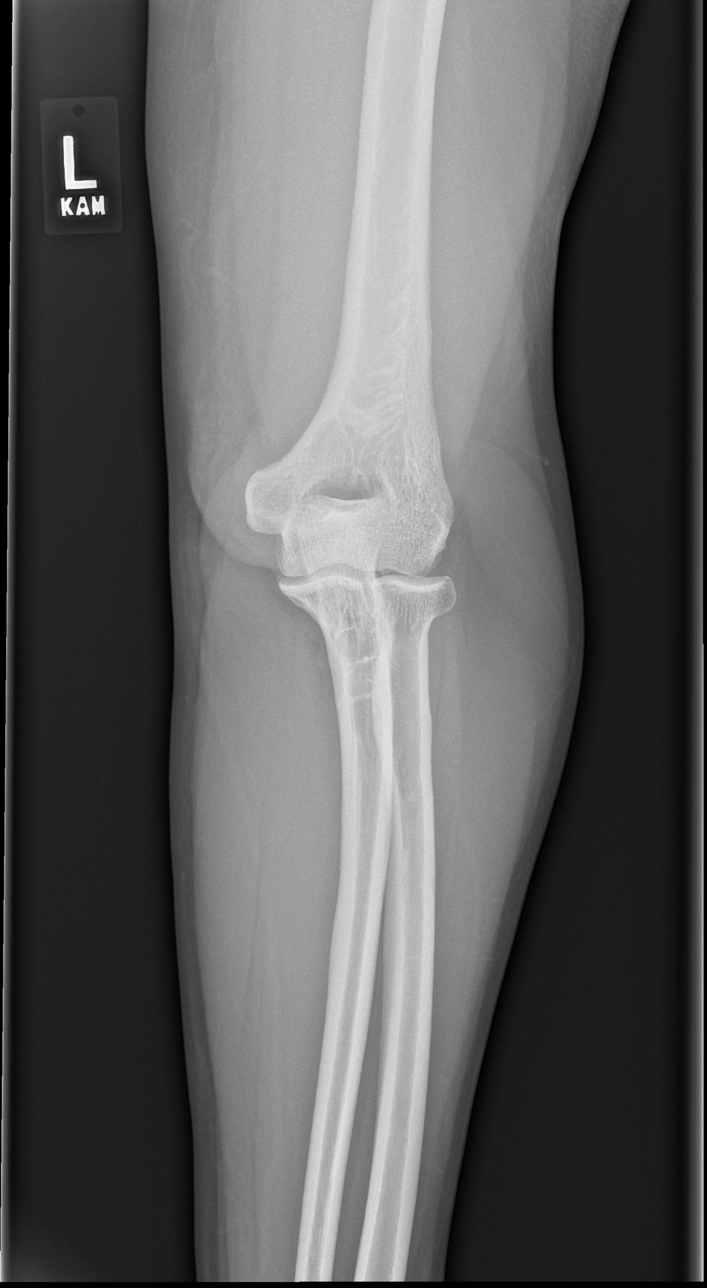

[x elbow lat left]
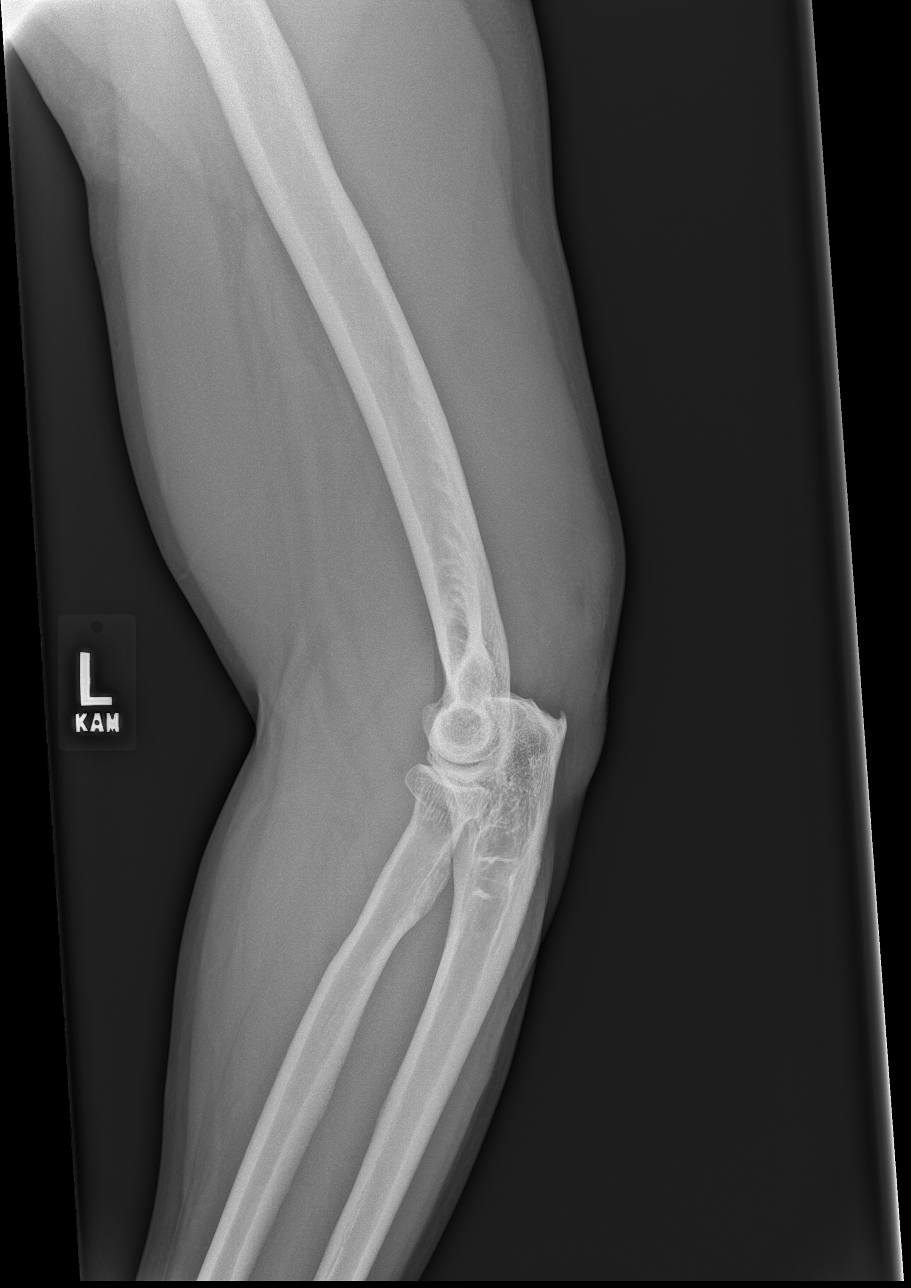

[4 of 4 positions shown; findings below may reference images not displayed]

FINDINGS: Frontal, lateral, and bilateral oblique views were obtained. No
fracture or dislocation. No joint effusion. No appreciable joint
space narrowing or erosion. There is a spur arising from the
olecranon process of the proximal ulna.
IMPRESSION: Spur arising from the olecranon process of the proximal ulna. No
appreciable joint space narrowing. No fracture or dislocation.

## 2022-03-14 ENCOUNTER — Emergency Department (HOSPITAL_COMMUNITY)
Admission: EM | Admit: 2022-03-14 | Discharge: 2022-03-14 | Discharge status: Against Medical Advice | Payer: 59 | Attending: Emergency Medicine | Admitting: Emergency Medicine

## 2022-03-14 ENCOUNTER — Emergency Department (HOSPITAL_COMMUNITY): Payer: 59

## 2022-03-14 ENCOUNTER — Encounter (HOSPITAL_COMMUNITY): Payer: Self-pay

## 2022-03-14 ENCOUNTER — Other Ambulatory Visit: Payer: Self-pay

## 2022-03-14 DIAGNOSIS — F17290 Nicotine dependence, other tobacco product, uncomplicated: Secondary | ICD-10-CM | POA: Diagnosis not present

## 2022-03-14 DIAGNOSIS — R202 Paresthesia of skin: Secondary | ICD-10-CM | POA: Diagnosis not present

## 2022-03-14 DIAGNOSIS — I10 Essential (primary) hypertension: Secondary | ICD-10-CM | POA: Diagnosis not present

## 2022-03-14 DIAGNOSIS — R079 Chest pain, unspecified: Secondary | ICD-10-CM | POA: Insufficient documentation

## 2022-03-14 DIAGNOSIS — Z5321 Procedure and treatment not carried out due to patient leaving prior to being seen by health care provider: Secondary | ICD-10-CM | POA: Insufficient documentation

## 2022-03-14 HISTORY — DX: Essential (primary) hypertension: I10

## 2022-03-14 LAB — CBC
HCT: 43.8 % (ref 39.0–52.0)
Hemoglobin: 14.6 g/dL (ref 13.0–17.0)
MCH: 29.8 pg (ref 26.0–34.0)
MCHC: 33.3 g/dL (ref 30.0–36.0)
MCV: 89.4 fL (ref 80.0–100.0)
Platelets: 213 10*3/uL (ref 150–400)
RBC: 4.9 MIL/uL (ref 4.22–5.81)
RDW: 14 % (ref 11.5–15.5)
WBC: 10.9 10*3/uL — ABNORMAL HIGH (ref 4.0–10.5)
nRBC: 0 % (ref 0.0–0.2)

## 2022-03-14 LAB — BASIC METABOLIC PANEL
Anion gap: 7 (ref 5–15)
BUN: 12 mg/dL (ref 6–20)
CO2: 26 mmol/L (ref 22–32)
Calcium: 9.3 mg/dL (ref 8.9–10.3)
Chloride: 106 mmol/L (ref 98–111)
Creatinine, Ser: 1.17 mg/dL (ref 0.61–1.24)
GFR, Estimated: >60 mL/min (ref >60)
Glucose, Bld: 94 mg/dL (ref 70–99)
Potassium: 4.7 mmol/L (ref 3.5–5.1)
Sodium: 139 mmol/L (ref 135–145)

## 2022-03-14 LAB — TROPONIN I (HIGH SENSITIVITY): Troponin I (High Sensitivity): 4 ng/L (ref <18)

## 2022-03-14 NOTE — ED Triage Notes (Signed)
Patient reports that he was awoken with cp and left arm pain. Once he got up all pain resolved but reports not feeling right. Has hx of anxiety and unsure if that or something else

## 2022-03-14 NOTE — ED Notes (Signed)
Called pt x2 for vitals, no response. °

## 2022-03-14 NOTE — ED Provider Triage Note (Signed)
Emergency Medicine Provider Triage Evaluation Note  Andrew Escobar , a 46 y.o. male  was evaluated in triage.  Pt complains of left-sided chest pain.  Patient states that this morning he had onset of a tight sensation on the left side of his chest followed by tingling in his chest and then felt it on the back of his left arm.  He states that he jumped out of bed and then felt like he might pass out.  He came to the emergency department for further evaluation.  He has a history of hypertension, high cholesterol takes medication for both of these and is a daily cigar smoker.  He also has a family history of primary relatives with early MI no other acute symptoms at this time and symptoms are resolved..  Review of Systems  Positive: Chest pain Negative: Fever  Physical Exam  BP 133/88 (BP Location: Right Arm)   Pulse 67   Temp 98.4 F (36.9 C) (Oral)   Resp 16   SpO2 100%  Gen:   Awake, no distress   Resp:  Normal effort  MSK:   Moves extremities without difficulty  Other: No tenderness to palpation  Medical Decision Making  Medically screening exam initiated at 11:34 AM.  Appropriate orders placed.  Andrew Escobar was informed that the remainder of the evaluation will be completed by another provider, this initial triage assessment does not replace that evaluation, and the importance of remaining in the ED until their evaluation is complete.  Chest pain, work-up in   Arthor Captain, PA-C 03/14/22 1136

## 2022-03-14 NOTE — ED Notes (Signed)
Called pt x3 for vitals, no response. 

## 2023-02-14 ENCOUNTER — Emergency Department (HOSPITAL_COMMUNITY): Payer: BLUE CROSS/BLUE SHIELD

## 2023-02-14 ENCOUNTER — Other Ambulatory Visit: Payer: Self-pay

## 2023-02-14 ENCOUNTER — Emergency Department (HOSPITAL_COMMUNITY)
Admission: EM | Admit: 2023-02-14 | Discharge: 2023-02-14 | Disposition: A | Discharge status: Home / Self Care | Payer: No Typology Code available for payment source | Attending: Emergency Medicine | Admitting: Emergency Medicine

## 2023-02-14 ENCOUNTER — Encounter (HOSPITAL_COMMUNITY): Payer: Self-pay | Admitting: Pharmacy Technician

## 2023-02-14 DIAGNOSIS — Z79899 Other long term (current) drug therapy: Secondary | ICD-10-CM | POA: Diagnosis not present

## 2023-02-14 DIAGNOSIS — Y990 Civilian activity done for income or pay: Secondary | ICD-10-CM | POA: Insufficient documentation

## 2023-02-14 DIAGNOSIS — I10 Essential (primary) hypertension: Secondary | ICD-10-CM | POA: Insufficient documentation

## 2023-02-14 DIAGNOSIS — R519 Headache, unspecified: Secondary | ICD-10-CM | POA: Diagnosis not present

## 2023-02-14 DIAGNOSIS — W228XXA Striking against or struck by other objects, initial encounter: Secondary | ICD-10-CM | POA: Insufficient documentation

## 2023-02-14 DIAGNOSIS — S161XXA Strain of muscle, fascia and tendon at neck level, initial encounter: Secondary | ICD-10-CM | POA: Diagnosis not present

## 2023-02-14 DIAGNOSIS — M542 Cervicalgia: Secondary | ICD-10-CM | POA: Diagnosis present

## 2023-02-14 MED ORDER — ACETAMINOPHEN 325 MG PO TABS
650.0000 mg | ORAL_TABLET | Freq: Once | ORAL | Status: AC
  Administered 2023-02-14: 650 mg via ORAL
  Filled 2023-02-14: qty 2

## 2023-02-14 MED ORDER — METHOCARBAMOL 500 MG PO TABS
500.0000 mg | ORAL_TABLET | Freq: Once | ORAL | Status: AC
  Administered 2023-02-14: 500 mg via ORAL
  Filled 2023-02-14: qty 1

## 2023-02-14 MED ORDER — METHOCARBAMOL 500 MG PO TABS: 500.0000 mg | ORAL_TABLET | Freq: Two times a day (BID) | ORAL | 0 refills | Status: AC

## 2023-02-14 NOTE — Discharge Instructions (Signed)
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
## 2023-02-14 NOTE — ED Triage Notes (Signed)
Pt here after having 4 trays fall on him several days ago. Now with L neck/shoulder/back pain. Reports neck stiffness.

## 2023-03-19 ENCOUNTER — Ambulatory Visit: Payer: Medicaid Other | Admitting: Podiatry

## 2023-03-19 ENCOUNTER — Ambulatory Visit (INDEPENDENT_AMBULATORY_CARE_PROVIDER_SITE_OTHER): Payer: Medicaid Other

## 2023-03-19 DIAGNOSIS — M2042 Other hammer toe(s) (acquired), left foot: Secondary | ICD-10-CM
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
